# Patient Record
Sex: Female | Born: 1955 | Race: Black or African American | Hispanic: No | Smoking: Never smoker
Health system: Southern US, Community
[De-identification: ages and names within clinical notes are randomized; demographics above are authoritative.]

## PROBLEM LIST (undated history)

## (undated) DIAGNOSIS — E119 Type 2 diabetes mellitus without complications: Secondary | ICD-10-CM

## (undated) DIAGNOSIS — I10 Essential (primary) hypertension: Secondary | ICD-10-CM

## (undated) DIAGNOSIS — E039 Hypothyroidism, unspecified: Secondary | ICD-10-CM

## (undated) DIAGNOSIS — M1990 Unspecified osteoarthritis, unspecified site: Secondary | ICD-10-CM

## (undated) DIAGNOSIS — E785 Hyperlipidemia, unspecified: Secondary | ICD-10-CM

## (undated) DIAGNOSIS — K219 Gastro-esophageal reflux disease without esophagitis: Secondary | ICD-10-CM

## (undated) DIAGNOSIS — G4730 Sleep apnea, unspecified: Secondary | ICD-10-CM

## (undated) DIAGNOSIS — M545 Low back pain, unspecified: Secondary | ICD-10-CM

## (undated) DIAGNOSIS — G8929 Other chronic pain: Secondary | ICD-10-CM

## (undated) DIAGNOSIS — I219 Acute myocardial infarction, unspecified: Secondary | ICD-10-CM

---

## 1982-01-08 HISTORY — PX: TUBAL LIGATION: SHX77

## 1997-11-08 DIAGNOSIS — I219 Acute myocardial infarction, unspecified: Secondary | ICD-10-CM

## 1997-11-08 HISTORY — DX: Acute myocardial infarction, unspecified: I21.9

## 2014-01-06 ENCOUNTER — Inpatient Hospital Stay (HOSPITAL_COMMUNITY)
Admission: EM | Admit: 2014-01-06 | Discharge: 2014-01-11 | DRG: 287 | Disposition: A | Discharge status: Home / Self Care | Payer: Medicare Other | Source: Other Acute Inpatient Hospital | Attending: Internal Medicine | Admitting: Internal Medicine

## 2014-01-06 ENCOUNTER — Encounter (HOSPITAL_COMMUNITY): Payer: Self-pay | Admitting: Internal Medicine

## 2014-01-06 DIAGNOSIS — K210 Gastro-esophageal reflux disease with esophagitis: Secondary | ICD-10-CM

## 2014-01-06 DIAGNOSIS — I2510 Atherosclerotic heart disease of native coronary artery without angina pectoris: Secondary | ICD-10-CM | POA: Diagnosis present

## 2014-01-06 DIAGNOSIS — D649 Anemia, unspecified: Secondary | ICD-10-CM | POA: Diagnosis present

## 2014-01-06 DIAGNOSIS — I700 Atherosclerosis of aorta: Secondary | ICD-10-CM | POA: Diagnosis present

## 2014-01-06 DIAGNOSIS — Z6841 Body Mass Index (BMI) 40.0 and over, adult: Secondary | ICD-10-CM | POA: Diagnosis not present

## 2014-01-06 DIAGNOSIS — R002 Palpitations: Secondary | ICD-10-CM | POA: Diagnosis present

## 2014-01-06 DIAGNOSIS — E876 Hypokalemia: Secondary | ICD-10-CM

## 2014-01-06 DIAGNOSIS — K219 Gastro-esophageal reflux disease without esophagitis: Secondary | ICD-10-CM | POA: Diagnosis present

## 2014-01-06 DIAGNOSIS — E119 Type 2 diabetes mellitus without complications: Secondary | ICD-10-CM

## 2014-01-06 DIAGNOSIS — M545 Low back pain: Secondary | ICD-10-CM | POA: Diagnosis present

## 2014-01-06 DIAGNOSIS — I2699 Other pulmonary embolism without acute cor pulmonale: Secondary | ICD-10-CM

## 2014-01-06 DIAGNOSIS — I10 Essential (primary) hypertension: Secondary | ICD-10-CM | POA: Diagnosis present

## 2014-01-06 DIAGNOSIS — E785 Hyperlipidemia, unspecified: Secondary | ICD-10-CM | POA: Diagnosis present

## 2014-01-06 DIAGNOSIS — G8929 Other chronic pain: Secondary | ICD-10-CM | POA: Diagnosis present

## 2014-01-06 DIAGNOSIS — I252 Old myocardial infarction: Secondary | ICD-10-CM

## 2014-01-06 DIAGNOSIS — R059 Cough, unspecified: Secondary | ICD-10-CM | POA: Diagnosis present

## 2014-01-06 DIAGNOSIS — R9439 Abnormal result of other cardiovascular function study: Secondary | ICD-10-CM | POA: Insufficient documentation

## 2014-01-06 DIAGNOSIS — G4733 Obstructive sleep apnea (adult) (pediatric): Secondary | ICD-10-CM | POA: Diagnosis present

## 2014-01-06 DIAGNOSIS — E039 Hypothyroidism, unspecified: Secondary | ICD-10-CM | POA: Diagnosis present

## 2014-01-06 DIAGNOSIS — R05 Cough: Secondary | ICD-10-CM

## 2014-01-06 DIAGNOSIS — R079 Chest pain, unspecified: Secondary | ICD-10-CM | POA: Diagnosis present

## 2014-01-06 DIAGNOSIS — B349 Viral infection, unspecified: Secondary | ICD-10-CM | POA: Diagnosis present

## 2014-01-06 DIAGNOSIS — R42 Dizziness and giddiness: Secondary | ICD-10-CM

## 2014-01-06 DIAGNOSIS — R0609 Other forms of dyspnea: Secondary | ICD-10-CM | POA: Diagnosis present

## 2014-01-06 DIAGNOSIS — I471 Supraventricular tachycardia, unspecified: Secondary | ICD-10-CM | POA: Diagnosis present

## 2014-01-06 HISTORY — DX: Acute myocardial infarction, unspecified: I21.9

## 2014-01-06 HISTORY — DX: Low back pain, unspecified: M54.50

## 2014-01-06 HISTORY — DX: Other chronic pain: G89.29

## 2014-01-06 HISTORY — DX: Type 2 diabetes mellitus without complications: E11.9

## 2014-01-06 HISTORY — DX: Gastro-esophageal reflux disease without esophagitis: K21.9

## 2014-01-06 HISTORY — DX: Hyperlipidemia, unspecified: E78.5

## 2014-01-06 HISTORY — DX: Sleep apnea, unspecified: G47.30

## 2014-01-06 HISTORY — DX: Low back pain: M54.5

## 2014-01-06 HISTORY — DX: Essential (primary) hypertension: I10

## 2014-01-06 HISTORY — DX: Hypothyroidism, unspecified: E03.9

## 2014-01-06 HISTORY — DX: Unspecified osteoarthritis, unspecified site: M19.90

## 2014-01-06 LAB — GLUCOSE, CAPILLARY: Glucose-Capillary: 109 mg/dL — ABNORMAL HIGH (ref 70–99)

## 2014-01-06 MED ORDER — GUAIFENESIN-DM 100-10 MG/5ML PO SYRP: 5.0000 mL | ORAL_SOLUTION | ORAL | Status: DC | PRN

## 2014-01-06 MED ORDER — HEPARIN SODIUM (PORCINE) 5000 UNIT/ML IJ SOLN
5000.0000 [IU] | Freq: Three times a day (TID) | INTRAMUSCULAR | Status: DC
  Administered 2014-01-07 – 2014-01-11 (×13): 5000 [IU] via SUBCUTANEOUS
  Filled 2014-01-06 (×12): qty 1

## 2014-01-06 MED ORDER — LEVOTHYROXINE SODIUM 50 MCG PO TABS
50.0000 ug | ORAL_TABLET | Freq: Every day | ORAL | Status: DC
  Administered 2014-01-07 – 2014-01-10 (×4): 50 ug via ORAL
  Filled 2014-01-06 (×4): qty 1

## 2014-01-06 MED ORDER — MORPHINE SULFATE 2 MG/ML IJ SOLN: 2.0000 mg | INTRAMUSCULAR | Status: DC | PRN

## 2014-01-06 MED ORDER — ATORVASTATIN CALCIUM 40 MG PO TABS
40.0000 mg | ORAL_TABLET | Freq: Every day | ORAL | Status: DC
  Administered 2014-01-07 – 2014-01-10 (×4): 40 mg via ORAL
  Filled 2014-01-06 (×4): qty 1

## 2014-01-06 MED ORDER — INSULIN GLARGINE 100 UNIT/ML ~~LOC~~ SOLN
15.0000 [IU] | Freq: Every day | SUBCUTANEOUS | Status: DC
  Filled 2014-01-06: qty 0.15

## 2014-01-06 MED ORDER — SODIUM CHLORIDE 0.9 % IJ SOLN
3.0000 mL | Freq: Two times a day (BID) | INTRAMUSCULAR | Status: DC
  Administered 2014-01-07 – 2014-01-11 (×7): 3 mL via INTRAVENOUS

## 2014-01-06 MED ORDER — SODIUM CHLORIDE 0.9 % IV SOLN
INTRAVENOUS | Status: DC
  Administered 2014-01-06: via INTRAVENOUS

## 2014-01-06 MED ORDER — NITROGLYCERIN 0.4 MG SL SUBL: 0.4000 mg | SUBLINGUAL_TABLET | SUBLINGUAL | Status: DC | PRN

## 2014-01-06 MED ORDER — INSULIN GLARGINE 100 UNIT/ML ~~LOC~~ SOLN
15.0000 [IU] | Freq: Every day | SUBCUTANEOUS | Status: DC
  Administered 2014-01-07: 15 [IU] via SUBCUTANEOUS
  Filled 2014-01-06: qty 0.15

## 2014-01-06 MED ORDER — DILTIAZEM HCL 30 MG PO TABS
30.0000 mg | ORAL_TABLET | Freq: Four times a day (QID) | ORAL | Status: DC
  Administered 2014-01-06 – 2014-01-08 (×6): 30 mg via ORAL
  Filled 2014-01-06 (×6): qty 1

## 2014-01-06 MED ORDER — INSULIN ASPART 100 UNIT/ML ~~LOC~~ SOLN
0.0000 [IU] | Freq: Three times a day (TID) | SUBCUTANEOUS | Status: DC
  Administered 2014-01-07: 1 [IU] via SUBCUTANEOUS
  Administered 2014-01-07: 2 [IU] via SUBCUTANEOUS
  Administered 2014-01-08 – 2014-01-09 (×3): 1 [IU] via SUBCUTANEOUS
  Administered 2014-01-10: 2 [IU] via SUBCUTANEOUS
  Administered 2014-01-11: 1 [IU] via SUBCUTANEOUS

## 2014-01-06 MED ORDER — INFLUENZA VAC SPLIT QUAD 0.5 ML IM SUSY
0.5000 mL | PREFILLED_SYRINGE | INTRAMUSCULAR | Status: AC
  Administered 2014-01-07: 0.5 mL via INTRAMUSCULAR
  Filled 2014-01-06: qty 0.5

## 2014-01-06 MED ORDER — ASPIRIN 325 MG PO TABS
325.0000 mg | ORAL_TABLET | Freq: Every day | ORAL | Status: DC
  Administered 2014-01-07 – 2014-01-10 (×4): 325 mg via ORAL
  Filled 2014-01-06 (×4): qty 1

## 2014-01-06 MED ORDER — IRBESARTAN 150 MG PO TABS
150.0000 mg | ORAL_TABLET | Freq: Every day | ORAL | Status: DC
  Administered 2014-01-07 – 2014-01-10 (×4): 150 mg via ORAL
  Filled 2014-01-06 (×4): qty 1

## 2014-01-06 MED ORDER — PNEUMOCOCCAL VAC POLYVALENT 25 MCG/0.5ML IJ INJ
0.5000 mL | INJECTION | INTRAMUSCULAR | Status: AC
  Administered 2014-01-07: 0.5 mL via INTRAMUSCULAR
  Filled 2014-01-06: qty 0.5

## 2014-01-06 MED ORDER — PANTOPRAZOLE SODIUM 40 MG PO TBEC
40.0000 mg | DELAYED_RELEASE_TABLET | Freq: Every day | ORAL | Status: DC
  Administered 2014-01-07 – 2014-01-10 (×4): 40 mg via ORAL
  Filled 2014-01-06 (×4): qty 1

## 2014-01-06 MED ORDER — ACETAMINOPHEN 650 MG RE SUPP: 650.0000 mg | Freq: Four times a day (QID) | RECTAL | Status: DC | PRN

## 2014-01-06 MED ORDER — ACETAMINOPHEN 325 MG PO TABS
650.0000 mg | ORAL_TABLET | Freq: Four times a day (QID) | ORAL | Status: DC | PRN
  Administered 2014-01-08: 650 mg via ORAL
  Filled 2014-01-06: qty 2

## 2014-01-06 NOTE — H&P (Addendum)
[facility] History and Physical  Jeanette Hill ZOX:[id number] DOB: [birth year] DOA: 01/06/2014  Referring physician: ED physician PCP: Alain Honey, PA-C  Specialists:   Chief Complaint: Palpitations and dizziness  HPI: Jeanette Hill is a 58 y.o. female with past medical history of diabetes mellitus, hypertension, hyperlipidemia, OSA, GERD, hypothyroidism, who presented with palpitations and dizziness. Patient is transferred from [facility] to [place].  Patient reports that at about 10:30 aM, she started having palpitation, and feeling that her heart was beating rapidly and abnormally. It was associated with dizziness and sweating. She does not have unilateral weakness or numbness in her extremities. No hearing, or vision change. Patient had 2 episode of mild chest pain while she was having palpitation, each time lasted for about 10-15 minutes. She was evaluated in the ED of [facility]. She was found to have SVT on EKG there. She was treated with the 25 mg of Cardizem, and SVT was converted to sinus rhythm before transfer to [facility] hospital. When I evaluated pt on the floor, she does not have chest pain or palpitation. Repeated EKG showed sinus rhythm.  Patient also reports having mild cough during last week. She coughs up a little whitish sputum. She does not have runny nose or sore throat. No fever, but has chills. No recent history of long distance traveling. No tenderness over the calf areas.  Patient denies fever, headaches, abdominal pain, diarrhea, constipation, dysuria, urgency, frequency, hematuria, skin rashes, leg swelling.  Work up in the ED demonstrates SVT by EKG. no leukocytosis. Portable chest x-ray had a limited study due to obscured soft tissues. No leukocytosis. Patient is admitted to inpatient for further evaluation and treatment.  Review of Systems: As presented in the history of presenting illness, rest negative.  Where does patient live?  At home Can  patient participate in ADLs? Yes  Allergy: Allergies not on file  Past Medical History  Diagnosis Date  . Hypertension   . Hyperlipidemia   . GERD (gastroesophageal reflux disease)   . Hypothyroidism   . Myocardial infarction 11/1997    "slight one"  . Sleep apnea     "at one time; not now" (01/06/2014)  . Type II diabetes mellitus   . Arthritis     "legs, knees, arms, joints" (01/06/2014)  . Chronic lower back pain     Past Surgical History  Procedure Laterality Date  . Tubal ligation  1984    Social History:  reports that she has never smoked. She has never used smokeless tobacco. She reports that she does not drink alcohol or use illicit drugs.  Family History:  Family History  Problem Relation Age of Onset  . Diabetes Mother   . Diabetes Sister   . Diabetes Brother   . Hypertension Brother   . Prostate cancer Father      Prior to Admission medications   Not on File    Physical Exam: Filed Vitals:   01/06/14 2209 01/06/14 2227 01/07/14 0003  BP: 157/92  170/94  Pulse: 81  78  Temp: 98.3 F (36.8 C)  97.5 F (36.4 C)  TempSrc: Oral  Oral  Resp:   18  Height:  5\' 4"  (1.626 m)   Weight:  121.02 kg (266 lb 12.8 oz)   SpO2: 95%  98%   General: Not in acute distress HEENT:       Eyes: PERRL, EOMI, no scleral icterus       ENT: No discharge from the ears and nose, no  pharynx injection, no tonsillar enlargement.        Neck: No JVD, no bruit, no mass felt. Cardiac: S1/S2, RRR, No murmurs, No gallops or rubs Pulm: Good air movement bilaterally. Clear to auscultation bilaterally. No rales, wheezing, rhonchi or rubs. There is mild chest wall tenderness over left front chest. Abd: Soft, nondistended, nontender, no rebound pain, no organomegaly, BS present Ext: No edema bilaterally. 2+DP/PT pulse bilaterally. No tenderness over the calf areas. Musculoskeletal: No joint deformities, erythema, or stiffness, ROM full Skin: No rashes.  Neuro: Alert and oriented X3,  cranial nerves II-XII grossly intact, muscle strength 5/5 in all extremeties, sensation to light touch intact. Brachial reflex 2+ bilaterally. Knee reflex 1+ bilaterally. Negative Babinski's sign. Normal finger to nose test. Psych: Patient is not psychotic, no suicidal or hemocidal ideation.  Labs on Admission:  Basic Metabolic Panel: No results for input(s): NA, K, CL, CO2, GLUCOSE, BUN, CREATININE, CALCIUM, MG, PHOS in the last 168 hours. Liver Function Tests: No results for input(s): AST, ALT, ALKPHOS, BILITOT, PROT, ALBUMIN in the last 168 hours. No results for input(s): LIPASE, AMYLASE in the last 168 hours. No results for input(s): AMMONIA in the last 168 hours. CBC: No results for input(s): WBC, NEUTROABS, HGB, HCT, MCV, PLT in the last 168 hours. Cardiac Enzymes: No results for input(s): CKTOTAL, CKMB, CKMBINDEX, TROPONINI in the last 168 hours.  BNP (last 3 results) No results for input(s): PROBNP in the last 8760 hours. CBG:  Recent Labs Lab 01/06/14 2351  GLUCAP 109*    Radiological Exams on Admission: No results found.  EKG: Independently reviewed.   Assessment/Plan Principal Problem:   Palpitation Active Problems:   Anemia   Dizziness   Essential hypertension   Diabetes mellitus without complication   HLD (hyperlipidemia)   OSA (obstructive sleep apnea)   GERD (gastroesophageal reflux disease)   Hypothyroidism   Chest pain   Cough  Palpitation: Patient had SVT which was converted to the sinus rhythm after treated with Cardizem. The triggering factors is not clear. Differential diagnosis include, ACS given her significant risk factors, hypoxia secondary to possible bronchitis or pneumonia given her cough, pulmonary embolism. - will admit to tele bed - trop x 3 - repeat CXR-2 view - 2 d echo - lipitor, ASA, prn NTG and morphine - Cardizem 30 mg q6h - stat D-dimer - check A1c, FLP and TSH - UDS - NPO - IVF: ns 100cc/h  Chest pain: Her significant  risk factors, we'll need to rule out ACS -trop x 3 -ekg in AM  Cough: mild cough, likely due to viral infection. -Repeat chest x-ray-2 view -Robitussin  Hypertension: Patient was on amlodipine and Benicar/HCTC at home. -will hold amlodipine since patient was started Cardizem -switch benicar to Irbesartan  Diabetes mellitus: Patient was on S, metformin and glipizide at home. - switch oral meds to sliding scale insulin -Decrease Lantus dose from 25 units to 15 units when patient is on nothing by mouth -check FLP  Hypothyroidism: Patient is on Synthroid 50 mcg at home  - continue Synthroid - check tsh  Hyperlipidemia: Patient was on Simvastatin at home  - switch to high potent Lipitor 40 mg daily  GERD: -Protonix  Anemia:  -check anemia panel  Addendum: 12:44 AM. Patient's d-dimer cameback positive at 1.25. -will stat CT angiogram to rule out PE.  DVT ppx: SQ Heparin    Code Status: Full code Family Communication: None at bed side.  Disposition Plan: Admit to inpatient   Date  of Service 01/07/2014    Lorretta HarpIU, Basya Casavant [facility] Pager [phone]  If 7PM-7AM, please contact night-coverage [email or web address] Password [email or web address] 01/07/2014, 12:26 AM

## 2014-01-07 ENCOUNTER — Encounter (HOSPITAL_COMMUNITY): Payer: Self-pay | Admitting: Radiology

## 2014-01-07 ENCOUNTER — Inpatient Hospital Stay (HOSPITAL_COMMUNITY): Payer: Medicare Other

## 2014-01-07 ENCOUNTER — Telehealth: Payer: Self-pay | Admitting: Cardiology

## 2014-01-07 DIAGNOSIS — I10 Essential (primary) hypertension: Secondary | ICD-10-CM

## 2014-01-07 DIAGNOSIS — R002 Palpitations: Secondary | ICD-10-CM

## 2014-01-07 DIAGNOSIS — I471 Supraventricular tachycardia: Secondary | ICD-10-CM

## 2014-01-07 DIAGNOSIS — G4733 Obstructive sleep apnea (adult) (pediatric): Secondary | ICD-10-CM

## 2014-01-07 DIAGNOSIS — R079 Chest pain, unspecified: Secondary | ICD-10-CM

## 2014-01-07 DIAGNOSIS — E876 Hypokalemia: Secondary | ICD-10-CM

## 2014-01-07 LAB — CBC
HCT: 35.1 % — ABNORMAL LOW (ref 36.0–46.0)
Hemoglobin: 10.7 g/dL — ABNORMAL LOW (ref 12.0–15.0)
MCH: 23.9 pg — ABNORMAL LOW (ref 26.0–34.0)
MCHC: 30.5 g/dL (ref 30.0–36.0)
MCV: 78.3 fL (ref 78.0–100.0)
Platelets: 223 10*3/uL (ref 150–400)
RBC: 4.48 MIL/uL (ref 3.87–5.11)
RDW: 17.1 % — ABNORMAL HIGH (ref 11.5–15.5)
WBC: 7.4 10*3/uL (ref 4.0–10.5)

## 2014-01-07 LAB — LIPID PANEL
CHOL/HDL RATIO: 4 ratio
Cholesterol: 123 mg/dL (ref 0–200)
HDL: 31 mg/dL — AB (ref >39)
LDL Cholesterol: 80 mg/dL (ref 0–99)
Triglycerides: 60 mg/dL (ref <150)
VLDL: 12 mg/dL (ref 0–40)

## 2014-01-07 LAB — FERRITIN: Ferritin: 95 ng/mL (ref 10–291)

## 2014-01-07 LAB — D-DIMER, QUANTITATIVE: D-Dimer, Quant: 1.53 ug/mL-FEU — ABNORMAL HIGH (ref 0.00–0.48)

## 2014-01-07 LAB — COMPREHENSIVE METABOLIC PANEL
ALK PHOS: 92 U/L (ref 39–117)
ALT: 16 U/L (ref 0–35)
AST: 19 U/L (ref 0–37)
Albumin: 3.4 g/dL — ABNORMAL LOW (ref 3.5–5.2)
Anion gap: 8 (ref 5–15)
BILIRUBIN TOTAL: 0.3 mg/dL (ref 0.3–1.2)
BUN: 10 mg/dL (ref 6–23)
CO2: 29 mmol/L (ref 19–32)
CREATININE: 0.87 mg/dL (ref 0.50–1.10)
Calcium: 9.3 mg/dL (ref 8.4–10.5)
Chloride: 102 mEq/L (ref 96–112)
GFR, EST AFRICAN AMERICAN: 82 mL/min — AB (ref >90)
GFR, EST NON AFRICAN AMERICAN: 71 mL/min — AB (ref >90)
Glucose, Bld: 120 mg/dL — ABNORMAL HIGH (ref 70–99)
Potassium: 3.4 mmol/L — ABNORMAL LOW (ref 3.5–5.1)
SODIUM: 139 mmol/L (ref 135–145)
Total Protein: 7.2 g/dL (ref 6.0–8.3)

## 2014-01-07 LAB — RAPID URINE DRUG SCREEN, HOSP PERFORMED
AMPHETAMINES: NOT DETECTED
BENZODIAZEPINES: NOT DETECTED
Barbiturates: NOT DETECTED
COCAINE: NOT DETECTED
OPIATES: NOT DETECTED
Tetrahydrocannabinol: NOT DETECTED

## 2014-01-07 LAB — EXPECTORATED SPUTUM ASSESSMENT W REFEX TO RESP CULTURE

## 2014-01-07 LAB — TROPONIN I
Troponin I: <0.03 ng/mL (ref <0.031)
Troponin I: <0.03 ng/mL (ref <0.031)

## 2014-01-07 LAB — IRON AND TIBC
IRON: 34 ug/dL — AB (ref 42–145)
SATURATION RATIOS: 11 % — AB (ref 20–55)
TIBC: 297 ug/dL (ref 250–470)
UIBC: 263 ug/dL (ref 125–400)

## 2014-01-07 LAB — RETICULOCYTES
RBC.: 4.81 MIL/uL (ref 3.87–5.11)
RETIC COUNT ABSOLUTE: 105.8 10*3/uL (ref 19.0–186.0)
Retic Ct Pct: 2.2 % (ref 0.4–3.1)

## 2014-01-07 LAB — GLUCOSE, CAPILLARY
GLUCOSE-CAPILLARY: 123 mg/dL — AB (ref 70–99)
GLUCOSE-CAPILLARY: 147 mg/dL — AB (ref 70–99)
Glucose-Capillary: 135 mg/dL — ABNORMAL HIGH (ref 70–99)
Glucose-Capillary: 169 mg/dL — ABNORMAL HIGH (ref 70–99)

## 2014-01-07 LAB — HEMOGLOBIN A1C
HEMOGLOBIN A1C: 6.7 % — AB (ref <5.7)
MEAN PLASMA GLUCOSE: 146 mg/dL — AB (ref <117)

## 2014-01-07 LAB — PROTIME-INR
INR: 1.22 (ref 0.00–1.49)
Prothrombin Time: 15.6 seconds — ABNORMAL HIGH (ref 11.6–15.2)

## 2014-01-07 LAB — FOLATE

## 2014-01-07 LAB — TSH: TSH: 1.794 u[IU]/mL (ref 0.350–4.500)

## 2014-01-07 LAB — EXPECTORATED SPUTUM ASSESSMENT W GRAM STAIN, RFLX TO RESP C

## 2014-01-07 LAB — BRAIN NATRIURETIC PEPTIDE: B Natriuretic Peptide: 117.2 pg/mL — ABNORMAL HIGH (ref 0.0–100.0)

## 2014-01-07 LAB — VITAMIN B12: Vitamin B-12: 937 pg/mL — ABNORMAL HIGH (ref 211–911)

## 2014-01-07 MED ORDER — IOHEXOL 350 MG/ML SOLN
80.0000 mL | Freq: Once | INTRAVENOUS | Status: AC | PRN
  Administered 2014-01-07: 74.5 mL via INTRAVENOUS

## 2014-01-07 MED ORDER — POTASSIUM CHLORIDE CRYS ER 20 MEQ PO TBCR
40.0000 meq | EXTENDED_RELEASE_TABLET | Freq: Once | ORAL | Status: AC
  Administered 2014-01-07: 40 meq via ORAL
  Filled 2014-01-07: qty 4

## 2014-01-07 MED ORDER — INSULIN GLARGINE 100 UNIT/ML ~~LOC~~ SOLN
25.0000 [IU] | Freq: Every day | SUBCUTANEOUS | Status: DC
  Administered 2014-01-08 – 2014-01-10 (×3): 25 [IU] via SUBCUTANEOUS
  Filled 2014-01-07 (×4): qty 0.25

## 2014-01-07 NOTE — Progress Notes (Signed)
PROGRESS NOTE  Jeanette Hill WUJ:[id number] DOB: 1955-08-20 DOA: 01/06/2014 PCP: Jeanette Hill, Jeanette Hill  Assessment/Plan: Palpitation:  -SVT which was converted to sinus rhythm after treated with Cardizem.  - trop x 3 neg - CXR-2 view: cardiomegaly - 2 d echo - lipitor, ASA, prn NTG and morphine - Cardizem 30 mg q6h - D-dimer + but CTA negative - check A1c, FLP and TSH -will need outpatient stress test and OSA   Chest pain: Her significant risk factors, we'll need to rule out ACS -trop x 3  Cough: mild cough, likely due to viral infection. -Robitussin  Hypertension: Patient was on amlodipine and Benicar/HCTC at home. -will hold amlodipine since patient was started Cardizem -switch benicar to -sartan  Diabetes mellitus: Patient was on S, metformin and glipizide at home. - switch oral meds to sliding scale insulin  Hypothyroidism: Patient is on Synthroid 50 mcg at home  - continue Synthroid -tsh ok  Hyperlipidemia: Patient was on Simvastatin at home  - switch to high potent Lipitor 40 mg daily  GERD: -Protonix  Anemia:  -check anemia panel  Hypokalemia -replete  Code Status: full Family Communication: patient Disposition Plan: home in AM if echo ok?   Consultants:  cards  Procedures:  echo   HPI/Subjective: Feeling better  Objective: Filed Vitals:   01/07/14 0537  BP: 136/78  Pulse: 90  Temp: 98.4 F (36.9 C)  Resp: 18    Intake/Output Summary (Last 24 hours) at 01/07/14 1116 Last data filed at 01/07/14 1000  Gross per 24 hour  Intake 966.67 ml  Output    750 ml  Net 216.67 ml   Filed Weights   01/06/14 2227 01/07/14 0537  Weight: 121.02 kg (266 lb 12.8 oz) 120.974 kg (266 lb 11.2 oz)    Exam:   General:  A+Ox3, NAD  Cardiovascular: rrr  Respiratory: clear  Abdomen: +BS, soft  Musculoskeletal: no edema   Data Reviewed: Basic Metabolic Panel:  Recent Labs Lab 01/07/14 0440  NA 139  K 3.4*  CL 102  CO2 29    GLUCOSE 120*  BUN 10  CREATININE 0.87  CALCIUM 9.3   Liver Function Tests:  Recent Labs Lab 01/07/14 0440  AST 19  ALT 16  ALKPHOS 92  BILITOT 0.3  PROT 7.2  ALBUMIN 3.4*   No results for input(s): LIPASE, AMYLASE in the last 168 hours. No results for input(s): AMMONIA in the last 168 hours. CBC:  Recent Labs Lab 01/07/14 0440  WBC 7.4  HGB 10.7*  HCT 35.1*  MCV 78.3  PLT 223   Cardiac Enzymes:  Recent Labs Lab 01/06/14 2348 01/07/14 0440  TROPONINI <0.03 <0.03   BNP (last 3 results) No results for input(s): PROBNP in the last 8760 hours. CBG:  Recent Labs Lab 01/06/14 2351 01/07/14 0735  GLUCAP 109* 135*    Recent Results (from the past 240 hour(s))  Culture, sputum-assessment     Status: None   Collection Time: 01/07/14  1:05 AM  Result Value Ref Range Status   Specimen Description SPUTUM  Final   Special Requests NONE  Final   Sputum evaluation   Final    THIS SPECIMEN IS ACCEPTABLE. RESPIRATORY CULTURE REPORT TO FOLLOW.   Report Status 01/07/2014 FINAL  Final     Studies: Dg Chest 2 View  01/07/2014   CLINICAL DATA:  Cough.  Chest pain.  Short of breath.  EXAM: CHEST  2 VIEW  COMPARISON:  None.  FINDINGS: Lateral view degraded by patient  arm position. Moderate thoracic spondylosis. Numerous leads and wires project over the chest. Degenerate changes in both glenohumeral joints. Midline trachea. Mild cardiomegaly. Aortic atherosclerosis. Mediastinal contours otherwise within normal limits. No pleural effusion or pneumothorax. No congestive failure. Clear lungs.  IMPRESSION: Cardiomegaly without congestive failure.  Aortic atherosclerosis.   Electronically Signed   By: Jeanette GreavesKyle  Talbot M.D.   On: 01/07/2014 00:51   Ct Angio Chest Pe W/cm &/or Wo Cm  01/07/2014   CLINICAL DATA:  Pulmonary embolism. Palpitations and positive D-dimer.  EXAM: CT ANGIOGRAPHY CHEST WITH CONTRAST  TECHNIQUE: Multidetector CT imaging of the chest was performed using the  standard protocol during bolus administration of intravenous contrast. Multiplanar CT image reconstructions and MIPs were obtained to evaluate the vascular anatomy.  CONTRAST:  74.105mL OMNIPAQUE IOHEXOL 350 MG/ML SOLN  COMPARISON:  None.  FINDINGS: THORACIC INLET/BODY WALL:  No acute abnormality.  MEDIASTINUM:  Cardiomegaly. No pericardial effusion. There is no convincing pulmonary embolism. Note that respiratory motion and soft tissue attenuation are limiting factors, with motion especially prominent at the right apex and at both bases. This particularly limits evaluation of subsegmental vessels. No acute aortic pathology. No adenopathy.  LUNG WINDOWS:  No consolidation.  No effusion.  No suspicious pulmonary nodule.  UPPER ABDOMEN:  No acute findings.  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  Review of the MIP images confirms the above findings.  IMPRESSION: 1. Negative for pulmonary embolism. Note that respiratory motion limits assessment at multiple levels, mainly of the subsegmental arterial tree. 2. Cardiomegaly without failure.   Electronically Signed   By: Jeanette PeaJonathan  Watts M.D.   On: 01/07/2014 02:28    Scheduled Meds: . aspirin  325 mg Oral Daily  . atorvastatin  40 mg Oral q1800  . diltiazem  30 mg Oral 4 times per day  . heparin  5,000 Units Subcutaneous 3 times per day  . insulin aspart  0-9 Units Subcutaneous TID WC  . insulin glargine  15 Units Subcutaneous Daily  . irbesartan  150 mg Oral Daily  . levothyroxine  50 mcg Oral QAC breakfast  . pantoprazole  40 mg Oral Q1200  . potassium chloride  40 mEq Oral Once  . sodium chloride  3 mL Intravenous Q12H   Continuous Infusions: . sodium chloride 100 mL/hr at 01/06/14 2344   Antibiotics Given (last 72 hours)    None      Principal Problem:   Palpitation Active Problems:   Anemia   Dizziness   Essential hypertension   Diabetes mellitus without complication   HLD  (hyperlipidemia)   OSA (obstructive sleep apnea)   GERD (gastroesophageal reflux disease)   Hypothyroidism   Chest pain   Cough    Time spent: 35 min    Jeanette Hill  [facility] Pager [phone] If 7PM-7AM, please contact night-coverage at [email or web address], password [email or web address] 01/07/2014, 11:16 AM  LOS: 1 day

## 2014-01-07 NOTE — Consult Note (Addendum)
Reason for Consult: Chest Pain Referring Physician: Ricardo JerichoVann  Jeanette Hill is an 58 y.o. female.  HPI:   The patient is a 58yo morbidly obese female with a history of HTN, HLD, GERD, CAD, DMII, OSA, hypothyroidism.  Patient was seen at [facility] with SVT which converted to NSR with cardizem.   Negative PE on CT angio.  CXR: cardiomegaly, Aortic atherosclerosis.  She had a CPAP at one point but it was taken away because she did not use it.  She had an MI in 1999.  No stents.  Her mom died at 1765 with "heart trouble".  Dad is 95 and has CAD.    The patient presents with dizziness, Rapid HR, diaphoresis and a little SOB.  This began yesterday.  No CP/tightness, orthopnea, PND.  She also reports a little abd pain.  The patient currently denies nausea, vomiting, fever,  PND, cough, congestion, hematochezia, melena, lower extremity edema, claudication.   Past Medical History  Diagnosis Date  . Hypertension   . Hyperlipidemia   . GERD (gastroesophageal reflux disease)   . Hypothyroidism   . Myocardial infarction 11/1997    "slight one"  . Sleep apnea     "at one time; not now" (01/06/2014)  . Type II diabetes mellitus   . Arthritis     "legs, knees, arms, joints" (01/06/2014)  . Chronic lower back pain     Past Surgical History  Procedure Laterality Date  . Tubal ligation  1984    Family History  Problem Relation Age of Onset  . Diabetes Mother   . Diabetes Sister   . Diabetes Brother   . Hypertension Brother   . Prostate cancer Father     Social History:  reports that she has never smoked. She has never used smokeless tobacco. She reports that she does not drink alcohol or use illicit drugs.  Allergies: Not on File  Medications:  Scheduled Meds: . aspirin  325 mg Oral Daily  . atorvastatin  40 mg Oral q1800  . diltiazem  30 mg Oral 4 times per day  . heparin  5,000 Units Subcutaneous 3 times per day  . Influenza vac split quadrivalent PF  0.5 mL Intramuscular  Tomorrow-1000  . insulin aspart  0-9 Units Subcutaneous TID WC  . insulin glargine  15 Units Subcutaneous Daily  . irbesartan  150 mg Oral Daily  . levothyroxine  50 mcg Oral QAC breakfast  . pantoprazole  40 mg Oral Q1200  . pneumococcal 23 valent vaccine  0.5 mL Intramuscular Tomorrow-1000  . sodium chloride  3 mL Intravenous Q12H   Continuous Infusions: . sodium chloride 100 mL/hr at 01/06/14 2344   PRN Meds:.acetaminophen **OR** acetaminophen, guaiFENesin-dextromethorphan, morphine injection, nitroGLYCERIN   Results for orders placed or performed during the hospital encounter of 01/06/14 (from the past 48 hour(s))  Troponin I (q 6hr x 3)     Status: None   Collection Time: 01/06/14 11:48 PM  Result Value Ref Range   Troponin I <0.03 <0.031 ng/mL    Comment:        NO INDICATION OF MYOCARDIAL INJURY. Please note change in reference range.   Reticulocytes     Status: None   Collection Time: 01/06/14 11:48 PM  Result Value Ref Range   Retic Ct Pct 2.2 0.4 - 3.1 %   RBC. 4.81 3.87 - 5.11 MIL/uL   Retic Count, Manual 105.8 19.0 - 186.0 K/uL  D-dimer, quantitative     Status:  Abnormal   Collection Time: 01/06/14 11:48 PM  Result Value Ref Range   D-Dimer, Quant 1.53 (H) 0.00 - 0.48 ug/mL-FEU    Comment:        AT THE INHOUSE ESTABLISHED CUTOFF VALUE OF 0.48 ug/mL FEU, THIS ASSAY HAS BEEN DOCUMENTED IN THE LITERATURE TO HAVE A SENSITIVITY AND NEGATIVE PREDICTIVE VALUE OF AT LEAST 98 TO 99%.  THE TEST RESULT SHOULD BE CORRELATED WITH AN ASSESSMENT OF THE CLINICAL PROBABILITY OF DVT / VTE.   Brain natriuretic peptide     Status: Abnormal   Collection Time: 01/06/14 11:48 PM  Result Value Ref Range   B Natriuretic Peptide 117.2 (H) 0.0 - 100.0 pg/mL    Comment: Please note change in reference range.  Glucose, capillary     Status: Abnormal   Collection Time: 01/06/14 11:51 PM  Result Value Ref Range   Glucose-Capillary 109 (H) 70 - 99 mg/dL  Culture,  sputum-assessment     Status: None   Collection Time: 01/07/14  1:05 AM  Result Value Ref Range   Specimen Description SPUTUM    Special Requests NONE    Sputum evaluation      THIS SPECIMEN IS ACCEPTABLE. RESPIRATORY CULTURE REPORT TO FOLLOW.   Report Status 01/07/2014 FINAL   Urine rapid drug screen (hosp performed)     Status: None   Collection Time: 01/07/14  2:24 AM  Result Value Ref Range   Opiates NONE DETECTED NONE DETECTED   Cocaine NONE DETECTED NONE DETECTED   Benzodiazepines NONE DETECTED NONE DETECTED   Amphetamines NONE DETECTED NONE DETECTED   Tetrahydrocannabinol NONE DETECTED NONE DETECTED   Barbiturates NONE DETECTED NONE DETECTED    Comment:        DRUG SCREEN FOR MEDICAL PURPOSES ONLY.  IF CONFIRMATION IS NEEDED FOR ANY PURPOSE, NOTIFY LAB WITHIN 5 DAYS.        LOWEST DETECTABLE LIMITS FOR URINE DRUG SCREEN Drug Class       Cutoff (ng/mL) Amphetamine      1000 Barbiturate      200 Benzodiazepine   200 Tricyclics       300 Opiates          300 Cocaine          300 THC              50   TSH     Status: None   Collection Time: 01/07/14  4:40 AM  Result Value Ref Range   TSH 1.794 0.350 - 4.500 uIU/mL  Lipid panel     Status: Abnormal   Collection Time: 01/07/14  4:40 AM  Result Value Ref Range   Cholesterol 123 0 - 200 mg/dL   Triglycerides 60 <960<150 mg/dL   HDL 31 (L) >45>39 mg/dL   Total CHOL/HDL Ratio 4.0 RATIO   VLDL 12 0 - 40 mg/dL   LDL Cholesterol 80 0 - 99 mg/dL    Comment:        Total Cholesterol/HDL:CHD Risk Coronary Heart Disease Risk Table                     Men   Women  1/2 Average Risk   3.4   3.3  Average Risk       5.0   4.4  2 X Average Risk   9.6   7.1  3 X Average Risk  23.4   11.0        Use the calculated Patient Ratio above  and the CHD Risk Table to determine the patient's CHD Risk.        ATP III CLASSIFICATION (LDL):  <100     mg/dL   Optimal  161-096  mg/dL   Near or Above                    Optimal  130-159  mg/dL    Borderline  045-409  mg/dL   High  >811     mg/dL   Very High   Protime-INR     Status: Abnormal   Collection Time: 01/07/14  4:40 AM  Result Value Ref Range   Prothrombin Time 15.6 (H) 11.6 - 15.2 seconds   INR 1.22 0.00 - 1.49  CBC     Status: Abnormal   Collection Time: 01/07/14  4:40 AM  Result Value Ref Range   WBC 7.4 4.0 - 10.5 K/uL   RBC 4.48 3.87 - 5.11 MIL/uL   Hemoglobin 10.7 (L) 12.0 - 15.0 g/dL   HCT 91.4 (L) 78.2 - 95.6 %   MCV 78.3 78.0 - 100.0 fL   MCH 23.9 (L) 26.0 - 34.0 pg   MCHC 30.5 30.0 - 36.0 g/dL   RDW 21.3 (H) 08.6 - 57.8 %   Platelets 223 150 - 400 K/uL  Glucose, capillary     Status: Abnormal   Collection Time: 01/07/14  7:35 AM  Result Value Ref Range   Glucose-Capillary 135 (H) 70 - 99 mg/dL    Dg Chest 2 View  46/96/2952   CLINICAL DATA:  Cough.  Chest pain.  Short of breath.  EXAM: CHEST  2 VIEW  COMPARISON:  None.  FINDINGS: Lateral view degraded by patient arm position. Moderate thoracic spondylosis. Numerous leads and wires project over the chest. Degenerate changes in both glenohumeral joints. Midline trachea. Mild cardiomegaly. Aortic atherosclerosis. Mediastinal contours otherwise within normal limits. No pleural effusion or pneumothorax. No congestive failure. Clear lungs.  IMPRESSION: Cardiomegaly without congestive failure.  Aortic atherosclerosis.   Electronically Signed   By: Jeronimo Greaves M.D.   On: 01/07/2014 00:51   Ct Angio Chest Pe W/cm &/or Wo Cm  01/07/2014   CLINICAL DATA:  Pulmonary embolism. Palpitations and positive D-dimer.  EXAM: CT ANGIOGRAPHY CHEST WITH CONTRAST  TECHNIQUE: Multidetector CT imaging of the chest was performed using the standard protocol during bolus administration of intravenous contrast. Multiplanar CT image reconstructions and MIPs were obtained to evaluate the vascular anatomy.  CONTRAST:  74.55mL OMNIPAQUE IOHEXOL 350 MG/ML SOLN  COMPARISON:  None.  FINDINGS: THORACIC INLET/BODY WALL:  No acute abnormality.   MEDIASTINUM:  Cardiomegaly. No pericardial effusion. There is no convincing pulmonary embolism. Note that respiratory motion and soft tissue attenuation are limiting factors, with motion especially prominent at the right apex and at both bases. This particularly limits evaluation of subsegmental vessels. No acute aortic pathology. No adenopathy.  LUNG WINDOWS:  No consolidation.  No effusion.  No suspicious pulmonary nodule.  UPPER ABDOMEN:  No acute findings.  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  Review of the MIP images confirms the above findings.  IMPRESSION: 1. Negative for pulmonary embolism. Note that respiratory motion limits assessment at multiple levels, mainly of the subsegmental arterial tree. 2. Cardiomegaly without failure.   Electronically Signed   By: Tiburcio Pea M.D.   On: 01/07/2014 02:28    Review of Systems  Constitutional: Positive for diaphoresis. Negative for fever.  HENT: Negative for congestion.   Respiratory: Positive  for shortness of breath. Negative for cough.   Cardiovascular: Positive for palpitations. Negative for chest pain, orthopnea, leg swelling and PND.  Gastrointestinal: Positive for abdominal pain. Negative for nausea, vomiting, blood in stool and melena.  Genitourinary: Negative for dysuria.  Musculoskeletal: Negative for myalgias.  Neurological: Positive for dizziness.  All other systems reviewed and are negative.  Blood pressure 136/78, pulse 90, temperature 98.4 F (36.9 C), temperature source Oral, resp. rate 18, height 5\' 4"  (1.626 m), weight 266 lb 11.2 oz (120.974 kg), SpO2 96 %. Physical Exam  Nursing note and vitals reviewed. Constitutional: She is oriented to person, place, and time. She appears well-developed. No distress.  Obese  HENT:  Head: Normocephalic and atraumatic.  Mouth/Throat: Oropharynx is clear and moist. No oropharyngeal exudate.  Eyes: EOM are normal. Pupils are equal,  round, and reactive to light. No scleral icterus.  Neck: Normal range of motion. Neck supple. No JVD present.  Cardiovascular: Normal rate, regular rhythm, S1 normal and S2 normal.   No murmur heard. Pulses:      Radial pulses are 2+ on the right side, and 2+ on the left side.       Dorsalis pedis pulses are 2+ on the right side, and 2+ on the left side.  No carotid bruits  Respiratory: Breath sounds normal. She has no wheezes. She has no rales.  Restricted effort  GI: Soft. Bowel sounds are normal. She exhibits no distension. There is no tenderness.  Musculoskeletal: She exhibits edema (Trace LEE).  Lymphadenopathy:    She has no cervical adenopathy.  Neurological: She is alert and oriented to person, place, and time. She exhibits normal muscle tone.  Skin: Skin is warm.  Skin moist  Psychiatric: She has a normal mood and affect.    Assessment/Plan: Principal Problem:   Palpitation Active Problems:   Anemia   Dizziness   Essential hypertension   Diabetes mellitus without complication   HLD (hyperlipidemia)   OSA (obstructive sleep apnea)   GERD (gastroesophageal reflux disease)   Hypothyroidism   Chest pain   Cough  Plan:   Check echocardiogram.  Now on cardizem. SVT could be related to untreated OSA and right heart strain.  Recommend repeat sleep study and emphasis on importance of CPAP.  Troponin cycling.  POC negative.  Consider nuclear stress to rule ischemia, however, no CP with SVT.Marland Kitchen   Lipids: LDL 80, TG 60. TSH  1.79.  Further recs after echo.  Wilburt Finlay, PAC 01/07/2014, 8:00 AM   I have personally reviewed chart and interviewed and examined patient and agree with note as outlined above by Wilburt Finlay, PA-C.  Patient has not had any chest pain in the past although does have some DOE.  Developed severe palpitations yesterday.  She also had very sharp stabbing CP during the palpitations that resolved after conversion to NSR.  She denies any exertional CP.  She does  have some chronic DOE but is morbidly obese.  EKG shows NSR with PAC's and septal infarct and LAFB.  Exam is benign.  Recommend checking 2D echo.  If LVF is normal then ok to discharge home on PO Cardizem and get an outpt 2 day Lexiscan myoview to assess for ischemia given her DOE.  I will also arrange for her to have a split night sleep study to rule out sleep apnea. Would change Cardizem to CD 180mg  daily at discharge.  Signed: Armanda Magic, MD [facility] 01/07/2014

## 2014-01-07 NOTE — Telephone Encounter (Signed)
Patient currently in the hospital but needs to be set up for a split night PSG for sleep apnea

## 2014-01-07 NOTE — Progress Notes (Signed)
Echocardiogram 2D Echocardiogram has been performed.  Dorothey BasemanReel, Masiyah Engen M 01/07/2014, 10:56 AM

## 2014-01-07 NOTE — Progress Notes (Signed)
Chaplain responded to spiritual care consult for pt requesting prayer. Pt is concerned about her heart and the way she has been feeling physically. Chaplain offered prayer and emotional support. Chaplain made pt aware of her services should she need chaplain support. Page chaplain as needed.    01/07/14 1300  Clinical Encounter Type  Visited With Patient  Visit Type Initial;Spiritual support  Referral From Nurse  Spiritual Encounters  Spiritual Needs Emotional;Prayer  Stress Factors  Patient Stress Factors Lack of knowledge  Jeanette Hill, Mayer MaskerCourtney F, Chaplain 01/07/2014 1:10 PM

## 2014-01-08 DIAGNOSIS — I471 Supraventricular tachycardia: Secondary | ICD-10-CM | POA: Diagnosis present

## 2014-01-08 DIAGNOSIS — R0609 Other forms of dyspnea: Secondary | ICD-10-CM | POA: Diagnosis present

## 2014-01-08 LAB — CBC
HEMATOCRIT: 35.5 % — AB (ref 36.0–46.0)
Hemoglobin: 10.8 g/dL — ABNORMAL LOW (ref 12.0–15.0)
MCH: 24.5 pg — ABNORMAL LOW (ref 26.0–34.0)
MCHC: 30.4 g/dL (ref 30.0–36.0)
MCV: 80.7 fL (ref 78.0–100.0)
PLATELETS: 209 10*3/uL (ref 150–400)
RBC: 4.4 MIL/uL (ref 3.87–5.11)
RDW: 17.2 % — AB (ref 11.5–15.5)
WBC: 6.7 10*3/uL (ref 4.0–10.5)

## 2014-01-08 LAB — BASIC METABOLIC PANEL
ANION GAP: 14 (ref 5–15)
BUN: 9 mg/dL (ref 6–23)
CALCIUM: 9.1 mg/dL (ref 8.4–10.5)
CO2: 22 mmol/L (ref 19–32)
Chloride: 103 mEq/L (ref 96–112)
Creatinine, Ser: 0.85 mg/dL (ref 0.50–1.10)
GFR calc Af Amer: 85 mL/min — ABNORMAL LOW (ref >90)
GFR calc non Af Amer: 73 mL/min — ABNORMAL LOW (ref >90)
Glucose, Bld: 145 mg/dL — ABNORMAL HIGH (ref 70–99)
Potassium: 4.1 mmol/L (ref 3.5–5.1)
Sodium: 139 mmol/L (ref 135–145)

## 2014-01-08 LAB — GLUCOSE, CAPILLARY
GLUCOSE-CAPILLARY: 144 mg/dL — AB (ref 70–99)
GLUCOSE-CAPILLARY: 134 mg/dL — AB (ref 70–99)
Glucose-Capillary: 97 mg/dL (ref 70–99)
Glucose-Capillary: 118 mg/dL — ABNORMAL HIGH (ref 70–99)

## 2014-01-08 MED ORDER — DILTIAZEM HCL ER COATED BEADS 180 MG PO CP24
180.0000 mg | ORAL_CAPSULE | Freq: Every day | ORAL | Status: DC
  Administered 2014-01-08 – 2014-01-10 (×3): 180 mg via ORAL
  Filled 2014-01-08 (×4): qty 1

## 2014-01-08 NOTE — Progress Notes (Signed)
PROGRESS NOTE  Jeanette Hill ZOX:[id number] DOB: 12/09/1955 DOA: 01/06/2014 PCP: Alain Honey, PA-C  Assessment/Plan: Palpitation:  -SVT which was converted to sinus rhythm after treated with Cardizem.  - trop x 3 neg - CXR-2 view: cardiomegaly - 2 d echo- EF 45 % - lipitor, ASA, prn NTG and morphine - Cardizem long acting - D-dimer + but CTA negative - check A1c, FLP and TSH -for 2 day stress test Need outpatient OSA eval  Chest pain: Her significant risk factors, we'll need to rule out ACS -trop x 3  Cough: mild cough, likely due to viral infection. -Robitussin  Hypertension: Patient was on amlodipine and Benicar/HCTC at home. -will hold amlodipine since patient was started Cardizem -switch benicar to -sartan  Diabetes mellitus: Patient was on S, metformin and glipizide at home. - switch oral meds to sliding scale insulin  Hypothyroidism: Patient is on Synthroid 50 mcg at home  - continue Synthroid -tsh ok  Hyperlipidemia: Patient was on Simvastatin at home  - switch to high potent Lipitor 40 mg daily  GERD: -Protonix  Anemia:  -check anemia panel  Hypokalemia -replete  Code Status: full Family Communication: patient Disposition Plan:    Consultants:  cards  Procedures:  echo   HPI/Subjective: No complaints, talking on the phone  Objective: Filed Vitals:   01/08/14 0448  BP: 135/74  Pulse: 77  Temp: 98 F (36.7 C)  Resp: 18    Intake/Output Summary (Last 24 hours) at 01/08/14 0941 Last data filed at 01/08/14 0449  Gross per 24 hour  Intake    240 ml  Output    300 ml  Net    -60 ml   Filed Weights   01/06/14 2227 01/07/14 0537 01/08/14 0448  Weight: 121.02 kg (266 lb 12.8 oz) 120.974 kg (266 lb 11.2 oz) 122.653 kg (270 lb 6.4 oz)    Exam:   General:  A+Ox3, NAD  Cardiovascular: rrr  Respiratory: clear  Abdomen: +BS, soft  Musculoskeletal: no edema   Data Reviewed: Basic Metabolic Panel:  Recent Labs Lab  01/07/14 0440 01/08/14 0444  NA 139 139  K 3.4* 4.1  CL 102 103  CO2 29 22  GLUCOSE 120* 145*  BUN 10 9  CREATININE 0.87 0.85  CALCIUM 9.3 9.1   Liver Function Tests:  Recent Labs Lab 01/07/14 0440  AST 19  ALT 16  ALKPHOS 92  BILITOT 0.3  PROT 7.2  ALBUMIN 3.4*   No results for input(s): LIPASE, AMYLASE in the last 168 hours. No results for input(s): AMMONIA in the last 168 hours. CBC:  Recent Labs Lab 01/07/14 0440 01/08/14 0444  WBC 7.4 6.7  HGB 10.7* 10.8*  HCT 35.1* 35.5*  MCV 78.3 80.7  PLT 223 209   Cardiac Enzymes:  Recent Labs Lab 01/06/14 2348 01/07/14 0440 01/07/14 1031  TROPONINI <0.03 <0.03 <0.03   BNP (last 3 results) No results for input(s): PROBNP in the last 8760 hours. CBG:  Recent Labs Lab 01/07/14 0735 01/07/14 1142 01/07/14 1633 01/07/14 2121 01/08/14 0745  GLUCAP 135* 169* 123* 147* 144*    Recent Results (from the past 240 hour(s))  Culture, sputum-assessment     Status: None   Collection Time: 01/07/14  1:05 AM  Result Value Ref Range Status   Specimen Description SPUTUM  Final   Special Requests NONE  Final   Sputum evaluation   Final    THIS SPECIMEN IS ACCEPTABLE. RESPIRATORY CULTURE REPORT TO FOLLOW.   Report Status 01/07/2014  FINAL  Final  Culture, respiratory (NON-Expectorated)     Status: None (Preliminary result)   Collection Time: 01/07/14  1:05 AM  Result Value Ref Range Status   Specimen Description SPUTUM  Final   Special Requests NONE  Final   Gram Stain   Final    FEW WBC PRESENT,BOTH PMN AND MONONUCLEAR RARE SQUAMOUS EPITHELIAL CELLS PRESENT FEW GRAM POSITIVE COCCI IN PAIRS Performed at [facility]    Culture PENDING  Incomplete   Report Status PENDING  Incomplete     Studies: Dg Chest 2 View  01/07/2014   CLINICAL DATA:  Cough.  Chest pain.  Short of breath.  EXAM: CHEST  2 VIEW  COMPARISON:  None.  FINDINGS: Lateral view degraded by patient arm position. Moderate thoracic  spondylosis. Numerous leads and wires project over the chest. Degenerate changes in both glenohumeral joints. Midline trachea. Mild cardiomegaly. Aortic atherosclerosis. Mediastinal contours otherwise within normal limits. No pleural effusion or pneumothorax. No congestive failure. Clear lungs.  IMPRESSION: Cardiomegaly without congestive failure.  Aortic atherosclerosis.   Electronically Signed   By: Jeronimo Greaves M.D.   On: 01/07/2014 00:51   Ct Angio Chest Pe W/cm &/or Wo Cm  01/07/2014   CLINICAL DATA:  Pulmonary embolism. Palpitations and positive D-dimer.  EXAM: CT ANGIOGRAPHY CHEST WITH CONTRAST  TECHNIQUE: Multidetector CT imaging of the chest was performed using the standard protocol during bolus administration of intravenous contrast. Multiplanar CT image reconstructions and MIPs were obtained to evaluate the vascular anatomy.  CONTRAST:  74.66mL OMNIPAQUE IOHEXOL 350 MG/ML SOLN  COMPARISON:  None.  FINDINGS: THORACIC INLET/BODY WALL:  No acute abnormality.  MEDIASTINUM:  Cardiomegaly. No pericardial effusion. There is no convincing pulmonary embolism. Note that respiratory motion and soft tissue attenuation are limiting factors, with motion especially prominent at the right apex and at both bases. This particularly limits evaluation of subsegmental vessels. No acute aortic pathology. No adenopathy.  LUNG WINDOWS:  No consolidation.  No effusion.  No suspicious pulmonary nodule.  UPPER ABDOMEN:  No acute findings.  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  Review of the MIP images confirms the above findings.  IMPRESSION: 1. Negative for pulmonary embolism. Note that respiratory motion limits assessment at multiple levels, mainly of the subsegmental arterial tree. 2. Cardiomegaly without failure.   Electronically Signed   By: Tiburcio Pea M.D.   On: 01/07/2014 02:28    Scheduled Meds: . aspirin  325 mg Oral Daily  . atorvastatin  40 mg Oral  q1800  . diltiazem  180 mg Oral Daily  . heparin  5,000 Units Subcutaneous 3 times per day  . insulin aspart  0-9 Units Subcutaneous TID WC  . insulin glargine  25 Units Subcutaneous Daily  . irbesartan  150 mg Oral Daily  . levothyroxine  50 mcg Oral QAC breakfast  . pantoprazole  40 mg Oral Q1200  . sodium chloride  3 mL Intravenous Q12H   Continuous Infusions: . sodium chloride 100 mL/hr at 01/06/14 2344   Antibiotics Given (last 72 hours)    None      Principal Problem:   Palpitation Active Problems:   Anemia   Dizziness   Essential hypertension   Diabetes mellitus without complication   HLD (hyperlipidemia)   OSA (obstructive sleep apnea)   GERD (gastroesophageal reflux disease)   Hypothyroidism   Chest pain   Cough   Hypokalemia   SVT (supraventricular tachycardia)   DOE (dyspnea on exertion)  Time spent: 25 min    Marlin Canary  [facility] Pager [phone] If 7PM-7AM, please contact night-coverage at [email or web address], password [facility] 01/08/2014, 9:41 AM  LOS: 2 days

## 2014-01-08 NOTE — Progress Notes (Addendum)
SUBJECTIVE:  Doing well.  NO complaints  OBJECTIVE:   Vitals:   Filed Vitals:   01/07/14 0003 01/07/14 0537 01/07/14 2043 01/08/14 0448  BP: 170/94 136/78 130/70 135/74  Pulse: 78 90 79 77  Temp: 97.5 F (36.4 C) 98.4 F (36.9 C) 98.1 F (36.7 C) 98 F (36.7 C)  TempSrc: Oral Oral Oral Oral  Resp: Height:      Weight:  266 lb 11.2 oz (120.974 kg)  270 lb 6.4 oz (122.653 kg)  SpO2: 98% 96% 93% 96%   I&O's:   Intake/Output Summary (Last 24 hours) at 01/08/14 0756 Last data filed at 01/08/14 0449  Gross per 24 hour  Intake    240 ml  Output    600 ml  Net   -360 ml   TELEMETRY: Reviewed telemetry pt in NSR with PAC's and several short bursts of nonsustained atrial tachycardia on tele     PHYSICAL EXAM General: Well developed, well nourished, in no acute distress Head: Eyes PERRLA, No xanthomas.   Normal cephalic and atramatic  Lungs:   Clear bilaterally to auscultation and percussion. Heart:   HRRR S1 S2 Pulses are 2+ & equal. Abdomen: Bowel sounds are positive, abdomen soft and non-tender without masses  Extremities:   No clubbing, cyanosis or edema.  DP +1 Neuro: Alert and oriented X 3. Psych:  Good affect, responds appropriately   LABS: Basic Metabolic Panel:  Recent Labs  16/10/96 0440 01/08/14 0444  NA 139 139  K 3.4* 4.1  CL 102 103  CO2 29 22  GLUCOSE 120* 145*  BUN 10 9  CREATININE 0.87 0.85  CALCIUM 9.3 9.1   Liver Function Tests:  Recent Labs  01/07/14 0440  AST 19  ALT 16  ALKPHOS 92  BILITOT 0.3  PROT 7.2  ALBUMIN 3.4*   No results for input(s): LIPASE, AMYLASE in the last 72 hours. CBC:  Recent Labs  01/07/14 0440 01/08/14 0444  WBC 7.4 6.7  HGB 10.7* 10.8*  HCT 35.1* 35.5*  MCV 78.3 80.7  PLT 223 209   Cardiac Enzymes:  Recent Labs  01/06/14 2348 01/07/14 0440 01/07/14 1031  TROPONINI <0.03 <0.03 <0.03   BNP: Invalid input(s): POCBNP D-Dimer:  Recent Labs  01/06/14 2348  DDIMER 1.53*    Hemoglobin A1C:  Recent Labs  01/07/14 0440  HGBA1C 6.7*   Fasting Lipid Panel:  Recent Labs  01/07/14 0440  CHOL 123  HDL 31*  LDLCALC 80  TRIG 60  CHOLHDL 4.0   Thyroid Function Tests:  Recent Labs  01/07/14 0440  TSH 1.794   Anemia Panel:  Recent Labs  01/06/14 2348  VITAMINB12 937*  FOLATE >20.0  FERRITIN 95  TIBC 297  IRON 34*  RETICCTPCT 2.2   Coag Panel:   Lab Results  Component Value Date   INR 1.22 01/07/2014    RADIOLOGY: Dg Chest 2 View  01/07/2014   CLINICAL DATA:  Cough.  Chest pain.  Short of breath.  EXAM: CHEST  2 VIEW  COMPARISON:  None.  FINDINGS: Lateral view degraded by patient arm position. Moderate thoracic spondylosis. Numerous leads and wires project over the chest. Degenerate changes in both glenohumeral joints. Midline trachea. Mild cardiomegaly. Aortic atherosclerosis. Mediastinal contours otherwise within normal limits. No pleural effusion or pneumothorax. No congestive failure. Clear lungs.  IMPRESSION: Cardiomegaly without congestive failure.  Aortic atherosclerosis.   Electronically Signed   By: Jeronimo Greaves M.D.   On: 01/07/2014 00:51  Ct Angio Chest Pe W/cm &/or Wo Cm  01/07/2014   CLINICAL DATA:  Pulmonary embolism. Palpitations and positive D-dimer.  EXAM: CT ANGIOGRAPHY CHEST WITH CONTRAST  TECHNIQUE: Multidetector CT imaging of the chest was performed using the standard protocol during bolus administration of intravenous contrast. Multiplanar CT image reconstructions and MIPs were obtained to evaluate the vascular anatomy.  CONTRAST:  74.33mL OMNIPAQUE IOHEXOL 350 MG/ML SOLN  COMPARISON:  None.  FINDINGS: THORACIC INLET/BODY WALL:  No acute abnormality.  MEDIASTINUM:  Cardiomegaly. No pericardial effusion. There is no convincing pulmonary embolism. Note that respiratory motion and soft tissue attenuation are limiting factors, with motion especially prominent at the right apex and at both bases. This particularly limits  evaluation of subsegmental vessels. No acute aortic pathology. No adenopathy.  LUNG WINDOWS:  No consolidation.  No effusion.  No suspicious pulmonary nodule.  UPPER ABDOMEN:  No acute findings.  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  Review of the MIP images confirms the above findings.  IMPRESSION: 1. Negative for pulmonary embolism. Note that respiratory motion limits assessment at multiple levels, mainly of the subsegmental arterial tree. 2. Cardiomegaly without failure.   Electronically Signed   By: Tiburcio Pea M.D.   On: 01/07/2014 02:28    Assessment/Plan: Principal Problem:  Palpitation with SVT and nonsustained atrial tachycardia Active Problems:  Anemia  Dizziness  Essential hypertension  Diabetes mellitus without complication  HLD (hyperlipidemia)  OSA (obstructive sleep apnea)  GERD (gastroesophageal reflux disease)  Hypothyroidism  Chest pain - atypical and sharp.  Cardiac enzymes negative x 3.  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  Will make NPO for 2 day Lexiscan myoview due to reduced LVF on echo.  She had CP with her SVT but it was atypical and very sharp.  She also has been having DOE which could be an anginal equivalent but also could be due to underlying morbid obesity.   2.  Change Cardizem to CD  daily and increase as needed for breakthrough atrial tachycardia   Quintella Reichert, MD  01/08/2014  7:56 AM

## 2014-01-09 ENCOUNTER — Inpatient Hospital Stay (HOSPITAL_COMMUNITY): Payer: Medicare Other

## 2014-01-09 ENCOUNTER — Other Ambulatory Visit (HOSPITAL_COMMUNITY): Payer: Medicare Other

## 2014-01-09 DIAGNOSIS — R072 Precordial pain: Secondary | ICD-10-CM

## 2014-01-09 LAB — GLUCOSE, CAPILLARY
Glucose-Capillary: 112 mg/dL — ABNORMAL HIGH (ref 70–99)
Glucose-Capillary: 92 mg/dL (ref 70–99)
Glucose-Capillary: 131 mg/dL — ABNORMAL HIGH (ref 70–99)
Glucose-Capillary: 117 mg/dL — ABNORMAL HIGH (ref 70–99)

## 2014-01-09 MED ORDER — TECHNETIUM TC 99M SESTAMIBI GENERIC - CARDIOLITE
30.0000 | Freq: Once | INTRAVENOUS | Status: AC | PRN
  Administered 2014-01-09: 30 via INTRAVENOUS

## 2014-01-09 MED ORDER — REGADENOSON 0.4 MG/5ML IV SOLN
INTRAVENOUS | Status: AC
  Filled 2014-01-09: qty 5

## 2014-01-09 NOTE — Progress Notes (Signed)
Patient ID: Jeanette Hill, female   DOB: 1955-07-08, 60 y.o.   MRN: [id number]   SUBJECTIVE:  Doing well.  NO complaints  OBJECTIVE:   Vitals:   Filed Vitals:   01/08/14 1111 01/08/14 1529 01/08/14 2128 01/09/14 0614  BP: 139/83 122/61 145/86 131/79  Pulse:  72 77 81  Temp:  98.2 F (36.8 C) 98.2 F (36.8 C) 98.3 F (36.8 C)  TempSrc:  Oral Oral Oral  Resp:  18    Height:      Weight:    120.884 kg (266 lb 8 oz)  SpO2:  99% 97% 96%   I&O's:    Intake/Output Summary (Last 24 hours) at 01/09/14 0900 Last data filed at 01/08/14 2030  Gross per 24 hour  Intake    243 ml  Output    700 ml  Net   -457 ml   TELEMETRY: Reviewed telemetry pt in NSR with PAC's and several short bursts of nonsustained atrial tachycardia on tele     PHYSICAL EXAM General: Well developed, well nourished, in no acute distress Head: Eyes PERRLA, No xanthomas.   Normal cephalic and atramatic  Lungs:   Clear bilaterally to auscultation and percussion. Heart:   HRRR S1 S2 Pulses are 2+ & equal. Abdomen: Bowel sounds are positive, abdomen soft and non-tender without masses  Extremities:   No clubbing, cyanosis or edema.  DP +1 Neuro: Alert and oriented X 3. Psych:  Good affect, responds appropriately   LABS: Basic Metabolic Panel:  Recent Labs  40/98/11 0440 01/08/14 0444  NA 139 139  K 3.4* 4.1  CL 102 103  CO2 29 22  GLUCOSE 120* 145*  BUN 10 9  CREATININE 0.87 0.85  CALCIUM 9.3 9.1   Liver Function Tests:  Recent Labs  01/07/14 0440  AST 19  ALT 16  ALKPHOS 92  BILITOT 0.3  PROT 7.2  ALBUMIN 3.4*   No results for input(s): LIPASE, AMYLASE in the last 72 hours. CBC:  Recent Labs  01/07/14 0440 01/08/14 0444  WBC 7.4 6.7  HGB 10.7* 10.8*  HCT 35.1* 35.5*  MCV 78.3 80.7  PLT 223 209   Cardiac Enzymes:  Recent Labs  01/06/14 2348 01/07/14 0440 01/07/14 1031  TROPONINI <0.03 <0.03 <0.03   BNP: Invalid input(s): POCBNP D-Dimer:  Recent Labs  01/06/14 2348    DDIMER 1.53*   Hemoglobin A1C:  Recent Labs  01/07/14 0440  HGBA1C 6.7*   Fasting Lipid Panel:  Recent Labs  01/07/14 0440  CHOL 123  HDL 31*  LDLCALC 80  TRIG 60  CHOLHDL 4.0   Thyroid Function Tests:  Recent Labs  01/07/14 0440  TSH 1.794   Anemia Panel:  Recent Labs  01/06/14 2348  VITAMINB12 937*  FOLATE >20.0  FERRITIN 95  TIBC 297  IRON 34*  RETICCTPCT 2.2   Coag Panel:   Lab Results  Component Value Date   INR 1.22 01/07/2014    RADIOLOGY: Dg Chest 2 View  01/07/2014   CLINICAL DATA:  Cough.  Chest pain.  Short of breath.  EXAM: CHEST  2 VIEW  COMPARISON:  None.  FINDINGS: Lateral view degraded by patient arm position. Moderate thoracic spondylosis. Numerous leads and wires project over the chest. Degenerate changes in both glenohumeral joints. Midline trachea. Mild cardiomegaly. Aortic atherosclerosis. Mediastinal contours otherwise within normal limits. No pleural effusion or pneumothorax. No congestive failure. Clear lungs.  IMPRESSION: Cardiomegaly without congestive failure.  Aortic atherosclerosis.   Electronically Signed  By: Jeronimo Greaves M.D.   On: 01/07/2014 00:51   Ct Angio Chest Pe W/cm &/or Wo Cm  01/07/2014   CLINICAL DATA:  Pulmonary embolism. Palpitations and positive D-dimer.  EXAM: CT ANGIOGRAPHY CHEST WITH CONTRAST  TECHNIQUE: Multidetector CT imaging of the chest was performed using the standard protocol during bolus administration of intravenous contrast. Multiplanar CT image reconstructions and MIPs were obtained to evaluate the vascular anatomy.  CONTRAST:  74.24mL OMNIPAQUE IOHEXOL 350 MG/ML SOLN  COMPARISON:  None.  FINDINGS: THORACIC INLET/BODY WALL:  No acute abnormality.  MEDIASTINUM:  Cardiomegaly. No pericardial effusion. There is no convincing pulmonary embolism. Note that respiratory motion and soft tissue attenuation are limiting factors, with motion especially prominent at the right apex and at both bases. This particularly  limits evaluation of subsegmental vessels. No acute aortic pathology. No adenopathy.  LUNG WINDOWS:  No consolidation.  No effusion.  No suspicious pulmonary nodule.  UPPER ABDOMEN:  No acute findings.  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  Review of the MIP images confirms the above findings.  IMPRESSION: 1. Negative for pulmonary embolism. Note that respiratory motion limits assessment at multiple levels, mainly of the subsegmental arterial tree. 2. Cardiomegaly without failure.   Electronically Signed   By: Tiburcio Pea M.D.   On: 01/07/2014 02:28    Assessment/Plan: Principal Problem:  Palpitation with SVT and nonsustained atrial tachycardia Active Problems:  Anemia  Dizziness  Essential hypertension  Diabetes mellitus without complication  HLD (hyperlipidemia)  OSA (obstructive sleep apnea)  GERD (gastroesophageal reflux disease)  Hypothyroidism  Chest pain - atypical and sharp.  Cardiac enzymes negative x 3.  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   2 day Lexiscan myoview ordered by Dr Mayford Knife  due to reduced LVF on echo.  She had CP with her SVT but it was atypical and very sharp.  She also has been having DOE which could be an anginal equivalent but also could be due to underlying morbid obesity.   2.  Changed Cardizem to CD  daily and increase as needed for breakthrough atrial tachycardia  Likely d/c late tomorrow if myovue low risk    Charlton Haws, MD  01/09/2014  9:00 AM

## 2014-01-09 NOTE — Progress Notes (Signed)
PROGRESS NOTE  Jeanette Hill:[id number] DOB: 28-Jul-1955 DOA: 01/06/2014 PCP: Alain Honey, PA-C  Assessment/Plan: Palpitation:  -SVT which was converted to sinus rhythm after treated with Cardizem.  - trop x 3 neg - CXR-2 view: cardiomegaly - 2 d echo- EF 45 % - lipitor, ASA, prn NTG and morphine - Cardizem long acting - D-dimer + but CTA negative - check A1c, FLP and TSH -for 2 day stress test- finished tomm Need outpatient OSA eval  Chest pain: Her significant risk factors, we'll need to rule out ACS -trop x 3  Cough: mild cough, likely due to viral infection. -Robitussin  Hypertension: Patient was on amlodipine and Benicar/HCTC at home. -will hold amlodipine since patient was started Cardizem -switch benicar to -sartan  Diabetes mellitus: Patient was on S, metformin and glipizide at home. - switch oral meds to sliding scale insulin  Hypothyroidism: Patient is on Synthroid 50 mcg at home  - continue Synthroid -tsh ok  Hyperlipidemia: Patient was on Simvastatin at home  - switch to high potent Lipitor 40 mg daily  GERD: -Protonix  Anemia:  -check anemia panel  Hypokalemia -replete  Code Status: full Family Communication: patient Disposition Plan: home in PM tomm if ST negative   Consultants:  cards  Procedures:  echo   HPI/Subjective: No complaints, talking on the phone  Objective: Filed Vitals:   01/09/14 0951  BP: 131/96  Pulse:   Temp:   Resp:     Intake/Output Summary (Last 24 hours) at 01/09/14 1235 Last data filed at 01/09/14 0900  Gross per 24 hour  Intake    480 ml  Output      0 ml  Net    480 ml   Filed Weights   01/07/14 0537 01/08/14 0448 01/09/14 0614  Weight: 120.974 kg (266 lb 11.2 oz) 122.653 kg (270 lb 6.4 oz) 120.884 kg (266 lb 8 oz)    Exam:   General:  A+Ox3, NAD- no increased work of breathing  Cardiovascular: rrr  Respiratory: clear  Abdomen: +BS, soft  Musculoskeletal: no edema   Data  Reviewed: Basic Metabolic Panel:  Recent Labs Lab 01/07/14 0440 01/08/14 0444  NA 139 139  K 3.4* 4.1  CL 102 103  CO2 29 22  GLUCOSE 120* 145*  BUN 10 9  CREATININE 0.87 0.85  CALCIUM 9.3 9.1   Liver Function Tests:  Recent Labs Lab 01/07/14 0440  AST 19  ALT 16  ALKPHOS 92  BILITOT 0.3  PROT 7.2  ALBUMIN 3.4*   No results for input(s): LIPASE, AMYLASE in the last 168 hours. No results for input(s): AMMONIA in the last 168 hours. CBC:  Recent Labs Lab 01/07/14 0440 01/08/14 0444  WBC 7.4 6.7  HGB 10.7* 10.8*  HCT 35.1* 35.5*  MCV 78.3 80.7  PLT 223 209   Cardiac Enzymes:  Recent Labs Lab 01/06/14 2348 01/07/14 0440 01/07/14 1031  TROPONINI <0.03 <0.03 <0.03   BNP (last 3 results) No results for input(s): PROBNP in the last 8760 hours. CBG:  Recent Labs Lab 01/08/14 1133 01/08/14 1628 01/08/14 2126 01/09/14 0749 01/09/14 1147  GLUCAP 134* 97 118* 112* 92    Recent Results (from the past 240 hour(s))  Culture, sputum-assessment     Status: None   Collection Time: 01/07/14  1:05 AM  Result Value Ref Range Status   Specimen Description SPUTUM  Final   Special Requests NONE  Final   Sputum evaluation   Final    THIS SPECIMEN  IS ACCEPTABLE. RESPIRATORY CULTURE REPORT TO FOLLOW.   Report Status 01/07/2014 FINAL  Final  Culture, respiratory (NON-Expectorated)     Status: None (Preliminary result)   Collection Time: 01/07/14  1:05 AM  Result Value Ref Range Status   Specimen Description SPUTUM  Final   Special Requests NONE  Final   Gram Stain   Final    FEW WBC PRESENT,BOTH PMN AND MONONUCLEAR RARE SQUAMOUS EPITHELIAL CELLS PRESENT FEW GRAM POSITIVE COCCI IN PAIRS Performed at [facility]    Culture PENDING  Incomplete   Report Status PENDING  Incomplete     Studies: No results found.  Scheduled Meds: . aspirin  325 mg Oral Daily  . atorvastatin  40 mg Oral q1800  . diltiazem  180 mg Oral Daily  . heparin  5,000 Units  Subcutaneous 3 times per day  . insulin aspart  0-9 Units Subcutaneous TID WC  . insulin glargine  25 Units Subcutaneous Daily  . irbesartan  150 mg Oral Daily  . levothyroxine  50 mcg Oral QAC breakfast  . pantoprazole  40 mg Oral Q1200  . sodium chloride  3 mL Intravenous Q12H   Continuous Infusions: . sodium chloride 100 mL/hr at 01/06/14 2344   Antibiotics Given (last 72 hours)    None      Principal Problem:   Palpitation Active Problems:   Anemia   Dizziness   Essential hypertension   Diabetes mellitus without complication   HLD (hyperlipidemia)   OSA (obstructive sleep apnea)   GERD (gastroesophageal reflux disease)   Hypothyroidism   Chest pain   Cough   Hypokalemia   SVT (supraventricular tachycardia)   DOE (dyspnea on exertion)    Time spent: 25 min    Disney Ruggiero  [facility] Pager [phone] If 7PM-7AM, please contact night-coverage at [email or web address], password [facility] 01/09/2014, 12:35 PM  LOS: 3 days

## 2014-01-10 ENCOUNTER — Inpatient Hospital Stay (HOSPITAL_COMMUNITY): Payer: Medicare Other

## 2014-01-10 DIAGNOSIS — I471 Supraventricular tachycardia: Principal | ICD-10-CM

## 2014-01-10 DIAGNOSIS — R079 Chest pain, unspecified: Secondary | ICD-10-CM

## 2014-01-10 LAB — CULTURE, RESPIRATORY: CULTURE: NORMAL

## 2014-01-10 LAB — GLUCOSE, CAPILLARY
GLUCOSE-CAPILLARY: 131 mg/dL — AB (ref 70–99)
GLUCOSE-CAPILLARY: 101 mg/dL — AB (ref 70–99)
Glucose-Capillary: 156 mg/dL — ABNORMAL HIGH (ref 70–99)
Glucose-Capillary: 117 mg/dL — ABNORMAL HIGH (ref 70–99)

## 2014-01-10 LAB — CULTURE, RESPIRATORY W GRAM STAIN

## 2014-01-10 MED ORDER — REGADENOSON 0.4 MG/5ML IV SOLN
0.4000 mg | Freq: Once | INTRAVENOUS | Status: AC
  Administered 2014-01-10: 0.4 mg via INTRAVENOUS
  Filled 2014-01-10: qty 5

## 2014-01-10 MED ORDER — TECHNETIUM TC 99M SESTAMIBI GENERIC - CARDIOLITE
30.0000 | Freq: Once | INTRAVENOUS | Status: AC | PRN
  Administered 2014-01-10: 30 via INTRAVENOUS

## 2014-01-10 MED ORDER — REGADENOSON 0.4 MG/5ML IV SOLN
INTRAVENOUS | Status: AC
  Filled 2014-01-10: qty 5

## 2014-01-10 MED ORDER — SODIUM CHLORIDE 0.9 % IJ SOLN: 3.0000 mL | Freq: Two times a day (BID) | INTRAMUSCULAR | Status: DC

## 2014-01-10 MED ORDER — SODIUM CHLORIDE 0.9 % IV SOLN: 250.0000 mL | INTRAVENOUS | Status: DC | PRN

## 2014-01-10 MED ORDER — SODIUM CHLORIDE 0.9 % IJ SOLN: 3.0000 mL | INTRAMUSCULAR | Status: DC | PRN

## 2014-01-10 MED ORDER — ASPIRIN 81 MG PO CHEW
81.0000 mg | CHEWABLE_TABLET | ORAL | Status: AC
  Administered 2014-01-11: 81 mg via ORAL
  Filled 2014-01-10: qty 1

## 2014-01-10 NOTE — Progress Notes (Signed)
Patient ID: Jeanette Hill, female   DOB: 07-22-1955, 59 y.o.   MRN: [id number] Reviewed myovue:  IMPRESSION: 1. Moderate sized area of Low anterior wall ischemia.  2. Global hypokinesis with anterior wall motion abnormality.  3. Left ventricular ejection fraction 29%  4. High-risk stress test findings*.  EF also down by echo   Will arrange cath tomorrow Discussed with patient willing to proceed Orders done and put on cath board  Charlton Haws

## 2014-01-10 NOTE — Progress Notes (Signed)
Patient ID: Jeanette Hill, female   DOB: 09/17/1955, 58 y.o.   MRN: [id number]   SUBJECTIVE:  Doing well.  NO complaints 2nd portion of myovue today   OBJECTIVE:   Vitals:   Filed Vitals:   01/09/14 0951 01/09/14 1403 01/09/14 2139 01/10/14 0715  BP: 131/96 100/81 137/82 143/71  Pulse:  72 77 77  Temp:   98.3 F (36.8 C) 98.3 F (36.8 C)  TempSrc:   Oral Oral  Resp:  18    Height:      Weight:    119.568 kg (263 lb 9.6 oz)  SpO2:  95% 96% 96%    TELEMETRY: Reviewed telemetry pt in NSR with PAC's and several short bursts of nonsustained atrial tachycardia on tele   PHYSICAL EXAM General: morbidly obese black female  Head: Eyes PERRLA, No xanthomas.   Normal cephalic and atramatic  Lungs:   Clear bilaterally to auscultation and percussion. Heart:   HRRR S1 S2 Pulses are 2+ & equal. Abdomen: Bowel sounds are positive, abdomen soft and non-tender without masses  Extremities:   No clubbing, cyanosis or edema.  DP +1 Neuro: Alert and oriented X 3. Psych:  Good affect, responds appropriately   LABS: Basic Metabolic Panel:  Recent Labs  01/08/14 0444  NA 139  K 4.1  CL 103  CO2 22  GLUCOSE 145*  BUN 9  CREATININE 0.85  CALCIUM 9.1    CBC:  Recent Labs  01/08/14 0444  WBC 6.7  HGB 10.8*  HCT 35.5*  MCV 80.7  PLT 209   Cardiac Enzymes:  Recent Labs  01/07/14 1031  TROPONINI <0.03   Coag Panel:   Lab Results  Component Value Date   INR 1.22 01/07/2014    RADIOLOGY: Dg Chest 2 View  01/07/2014   CLINICAL DATA:  Cough.  Chest pain.  Short of breath.  EXAM: CHEST  2 VIEW  COMPARISON:  None.  FINDINGS: Lateral view degraded by patient arm position. Moderate thoracic spondylosis. Numerous leads and wires project over the chest. Degenerate changes in both glenohumeral joints. Midline trachea. Mild cardiomegaly. Aortic atherosclerosis. Mediastinal contours otherwise within normal limits. No pleural effusion or pneumothorax. No congestive failure. Clear lungs.   IMPRESSION: Cardiomegaly without congestive failure.  Aortic atherosclerosis.   Electronically Signed   By: Kyle  Talbot M.D.   On: 01/07/2014 00:51   Ct Angio Chest Pe W/cm &/or Wo Cm  01/07/2014   CLINICAL DATA:  Pulmonary embolism. Palpitations and positive D-dimer.  EXAM: CT ANGIOGRAPHY CHEST WITH CONTRAST  TECHNIQUE: Multidetector CT imaging of the chest was performed using the standard protocol during bolus administration of intravenous contrast. Multiplanar CT image reconstructions and MIPs were obtained to evaluate the vascular anatomy.  CONTRAST:  74.5mL OMNIPAQUE IOHEXOL 350 MG/ML SOLN  COMPARISON:  None.  FINDINGS: THORACIC INLET/BODY WALL:  No acute abnormality.  MEDIASTINUM:  Cardiomegaly. No pericardial effusion. There is no convincing pulmonary embolism. Note that respiratory motion and soft tissue attenuation are limiting factors, with motion especially prominent at the right apex and at both bases. This particularly limits evaluation of subsegmental vessels. No acute aortic pathology. No adenopathy.  LUNG WINDOWS:  No consolidation.  No effusion.  No suspicious pulmonary nodule.  UPPER ABDOMEN:  No acute findings.  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  Review of the MIP images confirms the above findings.  IMPRESSION: 1. Negative for pulmonary embolism. Note that respiratory motion limits assessment at multiple levels, mainly of the subsegmental arterial   tree. 2. Cardiomegaly without failure.   Electronically Signed   By: Tiburcio Pea M.D.   On: 01/07/2014 02:28    Assessment/Plan: Principal Problem:  Palpitation with SVT and nonsustained atrial tachycardia Active Problems:  Anemia  Dizziness  Essential hypertension  Diabetes mellitus without complication  HLD (hyperlipidemia)  OSA (obstructive sleep apnea)  GERD (gastroesophageal reflux disease)  Hypothyroidism  Chest pain - atypical and sharp.  Cardiac enzymes  negative x 3.  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   2 day Lexiscan myoview ordered by Dr Mayford Knife  due to reduced LVF on echo.  She had CP with her SVT but it was atypical and very sharp.  She also has been having DOE which could be an anginal equivalent but also could be due to underlying morbid obesity.   2.  Changed Cardizem to CD  daily and increase as needed for breakthrough atrial tachycardia  Likely d/c late today  if myovue low risk    Charlton Haws, MD  01/10/2014  9:08 AM

## 2014-01-10 NOTE — Progress Notes (Addendum)
PROGRESS NOTE  Jeanette Hill WJX:[id number] DOB: [birth year] DOA: 01/06/2014 PCP: Alain Honey, PA-C  Patient was admitted after SVT episode.  High risk stress test and plan for cath on Monday   Assessment/Plan: Palpitation:  -SVT which was converted to sinus rhythm after treated with Cardizem.  - trop x 3 neg - CXR-2 view: cardiomegaly - 2 d echo- EF 45 % - lipitor, ASA, prn NTG and morphine - Cardizem long acting - D-dimer + but CTA negative - check A1c, FLP and TSH -high risk stress test- notified cards Need outpatient OSA eval  Chest pain: Her significant risk factors, we'll need to rule out ACS -trop x 3  Cough: mild cough, likely due to viral infection. -Robitussin  Hypertension: Patient was on amlodipine and Benicar/HCTC at home. -will hold amlodipine since patient was started Cardizem -switch benicar to -sartan  Diabetes mellitus: Patient was on S, metformin and glipizide at home. - switch oral meds to sliding scale insulin  Hypothyroidism: Patient is on Synthroid 50 mcg at home  - continue Synthroid -tsh ok  Hyperlipidemia: Patient was on Simvastatin at home  - switch to high potent Lipitor 40 mg daily  GERD: -Protonix  Anemia:  -check anemia panel  Hypokalemia -replete  Code Status: full Family Communication: patient Disposition Plan: home in PM tomm if ST negative   Consultants:  cards  Procedures:  echo   HPI/Subjective: No complaints  Objective: Filed Vitals:   01/10/14 1004  BP: 157/90  Pulse: 94  Temp:   Resp:    No intake or output data in the 24 hours ending 01/10/14 1304 Filed Weights   01/08/14 0448 01/09/14 0614 01/10/14 0715  Weight: 122.653 kg (270 lb 6.4 oz) 120.884 kg (266 lb 8 oz) 119.568 kg (263 lb 9.6 oz)    Exam:   General:  A+Ox3, NAD- no increased work of breathing  Cardiovascular: rrr  Respiratory: clear  Abdomen: +BS, soft  Musculoskeletal: no edema   Data Reviewed: Basic Metabolic  Panel:  Recent Labs Lab 01/07/14 0440 01/08/14 0444  NA 139 139  K 3.4* 4.1  CL 102 103  CO2 29 22  GLUCOSE 120* 145*  BUN 10 9  CREATININE 0.87 0.85  CALCIUM 9.3 9.1   Liver Function Tests:  Recent Labs Lab 01/07/14 0440  AST 19  ALT 16  ALKPHOS 92  BILITOT 0.3  PROT 7.2  ALBUMIN 3.4*   No results for input(s): LIPASE, AMYLASE in the last 168 hours. No results for input(s): AMMONIA in the last 168 hours. CBC:  Recent Labs Lab 01/07/14 0440 01/08/14 0444  WBC 7.4 6.7  HGB 10.7* 10.8*  HCT 35.1* 35.5*  MCV 78.3 80.7  PLT 223 209   Cardiac Enzymes:  Recent Labs Lab 01/06/14 2348 01/07/14 0440 01/07/14 1031  TROPONINI <0.03 <0.03 <0.03   BNP (last 3 results) No results for input(s): PROBNP in the last 8760 hours. CBG:  Recent Labs Lab 01/09/14 1147 01/09/14 1637 01/09/14 2137 01/10/14 0743 01/10/14 1149  GLUCAP 92 131* 117* 131* 156*    Recent Results (from the past 240 hour(s))  Culture, sputum-assessment     Status: None   Collection Time: 01/07/14  1:05 AM  Result Value Ref Range Status   Specimen Description SPUTUM  Final   Special Requests NONE  Final   Sputum evaluation   Final    THIS SPECIMEN IS ACCEPTABLE. RESPIRATORY CULTURE REPORT TO FOLLOW.   Report Status 01/07/2014 FINAL  Final  Culture, respiratory (NON-Expectorated)  Status: None   Collection Time: 01/07/14  1:05 AM  Result Value Ref Range Status   Specimen Description SPUTUM  Final   Special Requests NONE  Final   Gram Stain   Final    FEW WBC PRESENT,BOTH PMN AND MONONUCLEAR RARE SQUAMOUS EPITHELIAL CELLS PRESENT FEW GRAM POSITIVE COCCI IN PAIRS Performed at [facility]    Culture   Final    NORMAL OROPHARYNGEAL FLORA Performed at [facility]    Report Status 01/10/2014 FINAL  Final     Studies: Nm Myocar Multi W/spect W/wall Motion / Ef  01/10/2014   CLINICAL DATA:  Chest pain and dyspnea on exertion.  EXAM: MYOCARDIAL IMAGING WITH SPECT  (REST AND PHARMACOLOGIC-STRESS - 2 DAY PROTOCOL)  GATED LEFT VENTRICULAR WALL MOTION STUDY  LEFT VENTRICULAR EJECTION FRACTION  TECHNIQUE: Standard myocardial SPECT imaging was performed after resting intravenous injection of 30 mCi Tc-44m sestamibi. Subsequently, on a second day, intravenous infusion of Lexiscan was performed under the supervision of the Cardiology staff. At peak effect of the drug, 30 mCi Tc-59m sestamibi was injected intravenously and standard myocardial SPECT imaging was performed. Quantitative gated imaging was also performed to evaluate left ventricular wall motion, and estimate left ventricular ejection fraction.  COMPARISON:  Chest CT 01/07/2014  FINDINGS: Perfusion: There is decreased activity in the low anterior wall on the stress images. This appears normal on the rest images and highly suspicious for ischemia. No fixed defects to suggest scar/infarction.  Wall Motion: Global hypokinesis and anterior wall motion abnormality.  Left Ventricular Ejection Fraction: 29 %  End diastolic volume 100 into ml  End systolic volume 73 ml  IMPRESSION: 1. Moderate sized area of Low anterior wall ischemia.  2. Global hypokinesis with anterior wall motion abnormality.  3. Left ventricular ejection fraction 29%  4. High-risk stress test findings*.  These results will be called to the ordering clinician or representative by the Radiologist Assistant, and communication documented in the PACS or zVision Dashboard.  *2012 Appropriate Use Criteria for Coronary Revascularization Focused Update: J Am Coll Cardiol. 2012;59(9):857-881. http://content.[email or web address].aspx?articleid=1201161   Electronically Signed   By: Loralie Champagne M.D.   On: 01/10/2014 12:55    Scheduled Meds: . aspirin  325 mg Oral Daily  . atorvastatin  40 mg Oral q1800  . diltiazem  180 mg Oral Daily  . heparin  5,000 Units Subcutaneous 3 times per day  . insulin aspart  0-9 Units Subcutaneous TID WC  . insulin glargine  25  Units Subcutaneous Daily  . irbesartan  150 mg Oral Daily  . levothyroxine  50 mcg Oral QAC breakfast  . pantoprazole  40 mg Oral Q1200  . sodium chloride  3 mL Intravenous Q12H   Continuous Infusions: . sodium chloride 100 mL/hr at 01/06/14 2344   Antibiotics Given (last 72 hours)    None      Principal Problem:   Palpitation Active Problems:   Anemia   Dizziness   Essential hypertension   Diabetes mellitus without complication   HLD (hyperlipidemia)   OSA (obstructive sleep apnea)   GERD (gastroesophageal reflux disease)   Hypothyroidism   Chest pain   Cough   Hypokalemia   SVT (supraventricular tachycardia)   DOE (dyspnea on exertion)    Time spent: 25 min    Corderius Saraceni  [facility] Pager [phone] If 7PM-7AM, please contact night-coverage at [email or web address], password [facility] 01/10/2014, 1:04 PM  LOS: 4 days

## 2014-01-10 NOTE — Progress Notes (Signed)
Results of stress test called to Dr. Benjamine Mola

## 2014-01-10 NOTE — Progress Notes (Signed)
Part 2 of 2 day lexiscan stress test completed without complication, pending result by [facility]  Signed, Azalee Course PA Pager: [phone]

## 2014-01-11 ENCOUNTER — Encounter (HOSPITAL_COMMUNITY)
Admission: EM | Disposition: A | Discharge status: Home / Self Care | Payer: Self-pay | Source: Other Acute Inpatient Hospital | Attending: Internal Medicine

## 2014-01-11 ENCOUNTER — Encounter (HOSPITAL_COMMUNITY): Payer: Self-pay | Admitting: Interventional Cardiology

## 2014-01-11 DIAGNOSIS — R0789 Other chest pain: Secondary | ICD-10-CM

## 2014-01-11 DIAGNOSIS — R9439 Abnormal result of other cardiovascular function study: Secondary | ICD-10-CM | POA: Insufficient documentation

## 2014-01-11 DIAGNOSIS — R931 Abnormal findings on diagnostic imaging of heart and coronary circulation: Secondary | ICD-10-CM

## 2014-01-11 DIAGNOSIS — R0609 Other forms of dyspnea: Secondary | ICD-10-CM

## 2014-01-11 HISTORY — PX: LEFT HEART CATHETERIZATION WITH CORONARY ANGIOGRAM: SHX5451

## 2014-01-11 LAB — RESPIRATORY VIRUS PANEL
Adenovirus: NOT DETECTED
Influenza A H1: NOT DETECTED
Influenza A H3: NOT DETECTED
Influenza A: NOT DETECTED
Influenza B: NOT DETECTED
Metapneumovirus: NOT DETECTED
PARAINFLUENZA 1 A: NOT DETECTED
Parainfluenza 2: NOT DETECTED
Parainfluenza 3: NOT DETECTED
Respiratory Syncytial Virus A: NOT DETECTED
Respiratory Syncytial Virus B: NOT DETECTED
Rhinovirus: NOT DETECTED

## 2014-01-11 LAB — GLUCOSE, CAPILLARY
GLUCOSE-CAPILLARY: 106 mg/dL — AB (ref 70–99)
GLUCOSE-CAPILLARY: 138 mg/dL — AB (ref 70–99)

## 2014-01-11 SURGERY — LEFT HEART CATHETERIZATION WITH CORONARY ANGIOGRAM
Anesthesia: LOCAL

## 2014-01-11 MED ORDER — METFORMIN HCL ER 500 MG PO TB24
500.0000 mg | ORAL_TABLET | Freq: Two times a day (BID) | ORAL | Status: DC
  Filled 2014-01-11 (×3): qty 1

## 2014-01-11 MED ORDER — METOPROLOL TARTRATE 50 MG PO TABS: 50.0000 mg | ORAL_TABLET | Freq: Two times a day (BID) | ORAL | Status: AC

## 2014-01-11 MED ORDER — OLMESARTAN MEDOXOMIL-HCTZ 20-12.5 MG PO TABS: 1.0000 | ORAL_TABLET | Freq: Every day | ORAL | Status: DC

## 2014-01-11 MED ORDER — ASPIRIN 81 MG PO CHEW: 81.0000 mg | CHEWABLE_TABLET | Freq: Every day | ORAL | Status: DC

## 2014-01-11 MED ORDER — LEVOTHYROXINE SODIUM 50 MCG PO TABS: 50.0000 ug | ORAL_TABLET | Freq: Every day | ORAL | Status: DC

## 2014-01-11 MED ORDER — ATORVASTATIN CALCIUM 40 MG PO TABS: 40.0000 mg | ORAL_TABLET | Freq: Every day | ORAL | Status: DC

## 2014-01-11 MED ORDER — INSULIN GLARGINE 100 UNIT/ML ~~LOC~~ SOLN
25.0000 [IU] | Freq: Every morning | SUBCUTANEOUS | Status: DC
  Administered 2014-01-11: 25 [IU] via SUBCUTANEOUS
  Filled 2014-01-11: qty 0.25

## 2014-01-11 MED ORDER — HEPARIN (PORCINE) IN NACL 2-0.9 UNIT/ML-% IJ SOLN
INTRAMUSCULAR | Status: AC
  Filled 2014-01-11: qty 1000

## 2014-01-11 MED ORDER — IRBESARTAN 150 MG PO TABS
150.0000 mg | ORAL_TABLET | Freq: Every day | ORAL | Status: DC
  Administered 2014-01-11: 150 mg via ORAL
  Filled 2014-01-11: qty 1

## 2014-01-11 MED ORDER — LIDOCAINE HCL (PF) 1 % IJ SOLN
INTRAMUSCULAR | Status: AC
  Filled 2014-01-11: qty 30

## 2014-01-11 MED ORDER — METOPROLOL TARTRATE 25 MG PO TABS
25.0000 mg | ORAL_TABLET | Freq: Two times a day (BID) | ORAL | Status: DC
  Administered 2014-01-11: 25 mg via ORAL
  Filled 2014-01-11: qty 1

## 2014-01-11 MED ORDER — TRAMADOL HCL 50 MG PO TABS: 50.0000 mg | ORAL_TABLET | ORAL | Status: DC | PRN

## 2014-01-11 MED ORDER — HYDROCHLOROTHIAZIDE 12.5 MG PO CAPS
12.5000 mg | ORAL_CAPSULE | Freq: Every day | ORAL | Status: DC
  Administered 2014-01-11: 12.5 mg via ORAL
  Filled 2014-01-11: qty 1

## 2014-01-11 MED ORDER — HEPARIN SODIUM (PORCINE) 1000 UNIT/ML IJ SOLN
INTRAMUSCULAR | Status: AC
  Filled 2014-01-11: qty 1

## 2014-01-11 MED ORDER — GLIPIZIDE 5 MG PO TABS: 5.0000 mg | ORAL_TABLET | Freq: Every day | ORAL | Status: DC

## 2014-01-11 MED ORDER — MIDAZOLAM HCL 2 MG/2ML IJ SOLN
INTRAMUSCULAR | Status: AC
  Filled 2014-01-11: qty 2

## 2014-01-11 MED ORDER — VERAPAMIL HCL 2.5 MG/ML IV SOLN
INTRAVENOUS | Status: AC
  Filled 2014-01-11: qty 2

## 2014-01-11 MED ORDER — PANTOPRAZOLE SODIUM 40 MG PO TBEC
40.0000 mg | DELAYED_RELEASE_TABLET | Freq: Every day | ORAL | Status: DC
  Administered 2014-01-11: 40 mg via ORAL
  Filled 2014-01-11: qty 1

## 2014-01-11 MED ORDER — GABAPENTIN 300 MG PO CAPS: 300.0000 mg | ORAL_CAPSULE | Freq: Every day | ORAL | Status: DC

## 2014-01-11 MED ORDER — ONDANSETRON HCL 4 MG/2ML IJ SOLN: 4.0000 mg | Freq: Four times a day (QID) | INTRAMUSCULAR | Status: DC | PRN

## 2014-01-11 MED ORDER — ACETAMINOPHEN 325 MG PO TABS
650.0000 mg | ORAL_TABLET | ORAL | Status: DC | PRN
Start: 2014-01-11 — End: 2014-01-11

## 2014-01-11 MED ORDER — FENTANYL CITRATE 0.05 MG/ML IJ SOLN
INTRAMUSCULAR | Status: AC
  Filled 2014-01-11: qty 2

## 2014-01-11 MED ORDER — SODIUM CHLORIDE 0.9 % IV SOLN: INTRAVENOUS | Status: AC

## 2014-01-11 MED ORDER — LORATADINE 10 MG PO TABS
10.0000 mg | ORAL_TABLET | Freq: Every day | ORAL | Status: DC
  Administered 2014-01-11: 10 mg via ORAL
  Filled 2014-01-11: qty 1

## 2014-01-11 NOTE — Care Management Note (Signed)
    Page 1 of 1   01/11/2014     2:10:20 PM CARE MANAGEMENT NOTE 01/11/2014  Patient:  Jeanette Hill, Jeanette Hill   Account Number:  [id number]  Date Initiated:  01/11/2014  Documentation initiated by:  GRAVES-BIGELOW,Kelseigh Diver  Subjective/Objective Assessment:   Pt admitted for cp and SVT. Sp stress test-abnormal and s/p cath 01-11-13.     Action/Plan:   No needs from CM at this time.   Anticipated DC Date:  01/11/2014   Anticipated DC Plan:  HOME/SELF CARE      DC Planning Services  CM consult      Choice offered to / List presented to:             Status of service:  Completed, signed off Medicare Important Message given?  YES (If response is "NO", the following Medicare IM given date fields will be blank) Date Medicare IM given:  01/11/2014 Medicare IM given by:  GRAVES-BIGELOW,Kjersti Dittmer Date Additional Medicare IM given:   Additional Medicare IM given by:    Discharge Disposition:  HOME/SELF CARE  Per UR Regulation:  Reviewed for med. necessity/level of care/duration of stay  If discussed at Long Length of Stay Meetings, dates discussed:   01/12/2014    Comments:

## 2014-01-11 NOTE — H&P (View-Only) (Signed)
Patient ID: Jeanette Hill, female   DOB: 1955-01-18, 59 y.o.   MRN: [id number]   SUBJECTIVE:  Doing well.  NO complaints 2nd portion of myovue today   OBJECTIVE:   Vitals:   Filed Vitals:   01/09/14 0951 01/09/14 1403 01/09/14 2139 01/10/14 0715  BP: 131/96 100/81 137/82 143/71  Pulse:  72 77 77  Temp:   98.3 F (36.8 C) 98.3 F (36.8 C)  TempSrc:   Oral Oral  Resp:  18    Height:      Weight:    119.568 kg (263 lb 9.6 oz)  SpO2:  95% 96% 96%    TELEMETRY: Reviewed telemetry pt in NSR with PAC's and several short bursts of nonsustained atrial tachycardia on tele   PHYSICAL EXAM General: morbidly obese black female  Head: Eyes PERRLA, No xanthomas.   Normal cephalic and atramatic  Lungs:   Clear bilaterally to auscultation and percussion. Heart:   HRRR S1 S2 Pulses are 2+ & equal. Abdomen: Bowel sounds are positive, abdomen soft and non-tender without masses  Extremities:   No clubbing, cyanosis or edema.  DP +1 Neuro: Alert and oriented X 3. Psych:  Good affect, responds appropriately   LABS: Basic Metabolic Panel:  Recent Labs  40/98/11 0444  NA 139  K 4.1  CL 103  CO2 22  GLUCOSE 145*  BUN 9  CREATININE 0.85  CALCIUM 9.1    CBC:  Recent Labs  01/08/14 0444  WBC 6.7  HGB 10.8*  HCT 35.5*  MCV 80.7  PLT 209   Cardiac Enzymes:  Recent Labs  01/07/14 1031  TROPONINI <0.03   Coag Panel:   Lab Results  Component Value Date   INR 1.22 01/07/2014    RADIOLOGY: Dg Chest 2 View  01/07/2014   CLINICAL DATA:  Cough.  Chest pain.  Short of breath.  EXAM: CHEST  2 VIEW  COMPARISON:  None.  FINDINGS: Lateral view degraded by patient arm position. Moderate thoracic spondylosis. Numerous leads and wires project over the chest. Degenerate changes in both glenohumeral joints. Midline trachea. Mild cardiomegaly. Aortic atherosclerosis. Mediastinal contours otherwise within normal limits. No pleural effusion or pneumothorax. No congestive failure. Clear lungs.   IMPRESSION: Cardiomegaly without congestive failure.  Aortic atherosclerosis.   Electronically Signed   By: Jeronimo Greaves M.D.   On: 01/07/2014 00:51   Ct Angio Chest Pe W/cm &/or Wo Cm  01/07/2014   CLINICAL DATA:  Pulmonary embolism. Palpitations and positive D-dimer.  EXAM: CT ANGIOGRAPHY CHEST WITH CONTRAST  TECHNIQUE: Multidetector CT imaging of the chest was performed using the standard protocol during bolus administration of intravenous contrast. Multiplanar CT image reconstructions and MIPs were obtained to evaluate the vascular anatomy.  CONTRAST:  74.54mL OMNIPAQUE IOHEXOL 350 MG/ML SOLN  COMPARISON:  None.  FINDINGS: THORACIC INLET/BODY WALL:  No acute abnormality.  MEDIASTINUM:  Cardiomegaly. No pericardial effusion. There is no convincing pulmonary embolism. Note that respiratory motion and soft tissue attenuation are limiting factors, with motion especially prominent at the right apex and at both bases. This particularly limits evaluation of subsegmental vessels. No acute aortic pathology. No adenopathy.  LUNG WINDOWS:  No consolidation.  No effusion.  No suspicious pulmonary nodule.  UPPER ABDOMEN:  No acute findings.  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  Review of the MIP images confirms the above findings.  IMPRESSION: 1. Negative for pulmonary embolism. Note that respiratory motion limits assessment at multiple levels, mainly of the subsegmental arterial  tree. 2. Cardiomegaly without failure.   Electronically Signed   By: Tiburcio Pea M.D.   On: 01/07/2014 02:28    Assessment/Plan: Principal Problem:  Palpitation with SVT and nonsustained atrial tachycardia Active Problems:  Anemia  Dizziness  Essential hypertension  Diabetes mellitus without complication  HLD (hyperlipidemia)  OSA (obstructive sleep apnea)  GERD (gastroesophageal reflux disease)  Hypothyroidism  Chest pain - atypical and sharp.  Cardiac enzymes  negative x 3.  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   2 day Lexiscan myoview ordered by Dr Mayford Knife  due to reduced LVF on echo.  She had CP with her SVT but it was atypical and very sharp.  She also has been having DOE which could be an anginal equivalent but also could be due to underlying morbid obesity.   2.  Changed Cardizem to CD  daily and increase as needed for breakthrough atrial tachycardia  Likely d/c late today  if myovue low risk    Charlton Haws, MD  01/10/2014  9:08 AM

## 2014-01-11 NOTE — Progress Notes (Signed)
Subjective: No CP  No SOB  Objective: Filed Vitals:   01/10/14 1450 01/10/14 2100 01/11/14 0548 01/11/14 0737  BP: 145/90 128/63 136/70   Pulse: 83 91 81 90  Temp: 97.8 F (36.6 C) 97.9 F (36.6 C) 98.3 F (36.8 C)   TempSrc: Oral  Oral   Resp: 18 78 18   Height:      Weight:   264 lb 8 oz (119.976 kg)   SpO2: 97% 95% 95%    Weight change:   Intake/Output Summary (Last 24 hours) at 01/11/14 0848 Last data filed at 01/10/14 2100  Gross per 24 hour  Intake    240 ml  Output      0 ml  Net    240 ml    General: Alert, awake, oriented x3, in no acute distress Neck:  JVP is normal Heart: Regular rate and rhythm, without murmurs, rubs, gallops.  Lungs: Clear to auscultation.  No rales or wheezes. Exemities:  No edema.   Neuro: Grossly intact, nonfocal.   Lab Results: Results for orders placed or performed during the hospital encounter of 01/06/14 (from the past 24 hour(s))  Glucose, capillary     Status: Abnormal   Collection Time: 01/10/14 11:49 AM  Result Value Ref Range   Glucose-Capillary 156 (H) 70 - 99 mg/dL  Glucose, capillary     Status: Abnormal   Collection Time: 01/10/14  5:01 PM  Result Value Ref Range   Glucose-Capillary 101 (H) 70 - 99 mg/dL  Glucose, capillary     Status: Abnormal   Collection Time: 01/10/14  9:30 PM  Result Value Ref Range   Glucose-Capillary 117 (H) 70 - 99 mg/dL    Studies/Results: Nm Myocar Multi W/spect W/wall Motion / Ef  01/10/2014   CLINICAL DATA:  Chest pain and dyspnea on exertion.  EXAM: MYOCARDIAL IMAGING WITH SPECT (REST AND PHARMACOLOGIC-STRESS - 2 DAY PROTOCOL)  GATED LEFT VENTRICULAR WALL MOTION STUDY  LEFT VENTRICULAR EJECTION FRACTION  TECHNIQUE: Standard myocardial SPECT imaging was performed after resting intravenous injection of 30 mCi Tc-28m sestamibi. Subsequently, on a second day, intravenous infusion of Lexiscan was performed under the supervision of the Cardiology staff. At peak effect of the drug, 30 mCi  Tc-69m sestamibi was injected intravenously and standard myocardial SPECT imaging was performed. Quantitative gated imaging was also performed to evaluate left ventricular wall motion, and estimate left ventricular ejection fraction.  COMPARISON:  Chest CT 01/07/2014  FINDINGS: Perfusion: There is decreased activity in the low anterior wall on the stress images. This appears normal on the rest images and highly suspicious for ischemia. No fixed defects to suggest scar/infarction.  Wall Motion: Global hypokinesis and anterior wall motion abnormality.  Left Ventricular Ejection Fraction: 29 %  End diastolic volume 100 into ml  End systolic volume 73 ml  IMPRESSION: 1. Moderate sized area of Low anterior wall ischemia.  2. Global hypokinesis with anterior wall motion abnormality.  3. Left ventricular ejection fraction 29%  4. High-risk stress test findings*.  These results will be called to the ordering clinician or representative by the Radiologist Assistant, and communication documented in the PACS or zVision Dashboard.  *2012 Appropriate Use Criteria for Coronary Revascularization Focused Update: J Am Coll Cardiol. 2012;59(9):857-881. http://content.[email or web address].aspx?articleid=1201161   Electronically Signed   By: Loralie Champagne M.D.   On: 01/10/2014 12:55    Medications:Reviewed   @  1. CP  Atypical  Cath with no CAD  LVEDP 10    2.  Mild LV dysfunction by echo  45 to 50%   Volume  OK   3.  SVT  Treated at outside hosp  Converted with cardiazem. I would switch to lopressor  Follow for recurrence     4  HTN  Continue medical Rx.    5.  Aortic atherosclerosis.  Needs to stay on statin.    I have set up appt for patient with Thursday Jan 28 with T Turner.  At 9:30 AM ([address] [employer])  Will be available as needed .    LOS: 5 days   Dietrich Pates 01/11/2014, 8:48 AM

## 2014-01-11 NOTE — CV Procedure (Signed)
       PROCEDURE:  Left heart catheterization with selective coronary angiography, left ventriculogram.  INDICATIONS:  Abnormal stress test  The risks, benefits, and details of the procedure were explained to the patient.  The patient verbalized understanding and wanted to proceed.  Informed written consent was obtained.  PROCEDURE TECHNIQUE:  After Xylocaine anesthesia a 46F slender sheath was placed in the right radial artery with a single anterior needle wall stick.   IV Heparin was given.  Right coronary angiography was done using a Judkins R4 guide catheter.  Left coronary angiography was done using a Judkins L3.5 guide catheter.  Left ventriculography was done using a pigtail catheter.  A TR band was used for hemostasis.   CONTRAST:  Total of 75 cc.  COMPLICATIONS:  None.    HEMODYNAMICS:  Aortic pressure was 139/73; LV pressure was 141/3; LVEDP 10.  There was no gradient between the left ventricle and aorta.    ANGIOGRAPHIC DATA:   The left main coronary artery is a large vessel which is widely patent.  The left anterior descending artery is a large vessel which wraps around the apex. There is a large first diagonal which is patent. The LAD system is without any significant disease.  The left circumflex artery is a large vessel which branches across lateral wall. There is a large first obtuse marginal which appears angiographically normal. The remainder of the circumflex is medium-sized and widely patent.  The right coronary artery is a large vessel which has only minimal plaquing. The posterior descending artery is tortuous but widely patent. The posterior lateral artery is large and widely patent.  LEFT VENTRICULOGRAM:  Left ventricular angiogram was done in the 30 RAO projection and revealed mildly decreased left ventricular systolic function with an estimated ejection fraction of 40-45 %.  LVEDP was 10 mmHg.  it is difficult to judge left ventricular ejection fraction due to  frequent PACs.  IMPRESSIONS:  1. Widely patent left main coronary artery. 2. Widely patent left anterior descending artery and its branches. 3. Widely patent left circumflex artery and its branches. 4. Widely patent right coronary artery. 5. Mildly decreased left ventricular systolic function.  LVEDP 10 mmHg.  Ejection fraction 40-45%.  RECOMMENDATION:  Continue medical therapy. Consider changing her calcium channel blocker to a beta blocker given the concern for decreased left ventricular ejection fraction.  Cardiology follow-up with Dr. Mayford Knife.

## 2014-01-11 NOTE — Discharge Summary (Signed)
Physician Discharge Summary  Jeanette Hill ZOX:[id number] DOB: 01/11/1955 DOA: 01/06/2014  PCP: Alain Honey, PA-C  Admit date: 01/06/2014 Discharge date: 01/11/2014  Time spent: greater than 30 minutes  Recommendations for Outpatient Follow-up:   Discharge Diagnoses:  Principal Problem:   Palpitation Active Problems:   Anemia   Dizziness   Essential hypertension   Diabetes mellitus without complication   HLD (hyperlipidemia)   OSA (obstructive sleep apnea)   GERD (gastroesophageal reflux disease)   Hypothyroidism   Chest pain   Cough   Hypokalemia   SVT (supraventricular tachycardia)   DOE (dyspnea on exertion)   Abnormal nuclear stress test   Discharge Condition: stable  Filed Weights   01/09/14 0614 01/10/14 0715 01/11/14 0548  Weight: 120.884 kg (266 lb 8 oz) 119.568 kg (263 lb 9.6 oz) 119.976 kg (264 lb 8 oz)    History of present illness:  59 y.o. female with past medical history of diabetes mellitus, hypertension, hyperlipidemia, OSA, GERD, hypothyroidism, who presented with palpitations and dizziness. Patient is transferred from [facility] to [place].  Patient reports that at about 10:30 aM, she started having palpitation, and feeling that her heart was beating rapidly and abnormally. It was associated with dizziness and sweating. She does not have unilateral weakness or numbness in her extremities. No hearing, or vision change. Patient had 2 episode of mild chest pain while she was having palpitation, each time lasted for about 10-15 minutes. She was evaluated in the ED of [facility]. She was found to have SVT on EKG there. She was treated with the 25 mg of Cardizem, and SVT was converted to sinus rhythm before transfer to [facility] hospital. When I evaluated pt on the floor, she does not have chest pain or palpitation. Repeated EKG showed sinus rhythm.  Patient also reports having mild cough during last week. She coughs up a little whitish sputum. She  does not have runny nose or sore throat. No fever, but has chills. No recent history of long distance traveling. No tenderness over the calf areas.  Patient denies fever, headaches, abdominal pain, diarrhea, constipation, dysuria, urgency, frequency, hematuria, skin rashes, leg swelling.  Work up in the ED demonstrates SVT by EKG. no leukocytosis. Portable chest x-ray had a limited study due to obscured soft tissues. No leukocytosis. Patient is admitted to inpatient for further evaluation and treatment.  Hospital Course:   -SVT converted to sinus rhythm after Cardizem.  - troponin x 3 neg - CXR-2 view: cardiomegaly - 2 d echo- EF 45 % - D-dimer + but CTA negative -high risk stress test. Cardiac cath with clean coronaries. EF 40-45%. cardizem changed to metoprolol, given low EF. Need outpatient OSA eval  Hypothyroidism: Patient is on Synthroid 50 mcg at home  - continue Synthroid -tsh ok  Hypokalemia corrected   Consultants: 1. cardiology  Procedures:  Cardiac catheterization 2. Widely patent left main coronary artery. 3. Widely patent left anterior descending artery and its branches. 4. Widely patent left circumflex artery and its branches. 5. Widely patent right coronary artery. 6. Mildly decreased left ventricular systolic function. LVEDP 10 mmHg. Ejection fraction 40-45%.  Discharge Exam: Filed Vitals:   01/11/14 1312  BP: 171/86  Pulse: 75  Temp:   Resp: 17    General: comfortable Cardiovascular: RRR Respiratory: CTA  Discharge Instructions   Discharge Instructions    Activity as tolerated - No restrictions    Complete by:  As directed      Diet - low sodium heart  healthy    Complete by:  As directed      Diet Carb Modified    Complete by:  As directed           Current Discharge Medication List    START taking these medications   Details  metoprolol tartrate (LOPRESSOR) 50 MG tablet Take 1 tablet (50 mg total) by mouth 2 (two) times  daily. Qty: 60 tablet, Refills: 1      CONTINUE these medications which have NOT CHANGED   Details  aspirin EC 325 MG tablet Take 325 mg by mouth daily.    atorvastatin (LIPITOR) 40 MG tablet Take 40 mg by mouth daily at 6 PM.    cetirizine (ZYRTEC) 10 MG tablet Take 10 mg by mouth at bedtime.    gabapentin (NEURONTIN) 300 MG capsule Take 300 mg by mouth at bedtime.    glipiZIDE (GLUCOTROL) 10 MG tablet Take 5 mg by mouth daily before breakfast.    insulin glargine (LANTUS) 100 UNIT/ML injection Inject 25 Units into the skin every morning.    levothyroxine (SYNTHROID, LEVOTHROID) 50 MCG tablet Take 50 mcg by mouth daily before breakfast.    metFORMIN (GLUCOPHAGE-XR) 500 MG 24 hr tablet Take 500 mg by mouth 2 (two) times daily.    olmesartan-hydrochlorothiazide (BENICAR HCT) 20-12.5 MG per tablet Take 1 tablet by mouth daily.    omeprazole (PRILOSEC) 20 MG capsule Take 20 mg by mouth daily.    traMADol (ULTRAM) 50 MG tablet Take 50 mg by mouth every 4 (four) hours as needed for moderate pain (leg pain).        Not on File    The results of significant diagnostics from this hospitalization (including imaging, microbiology, ancillary and laboratory) are listed below for reference.    Significant Diagnostic Studies: Dg Chest 2 View  01/07/2014   CLINICAL DATA:  Cough.  Chest pain.  Short of breath.  EXAM: CHEST  2 VIEW  COMPARISON:  None.  FINDINGS: Lateral view degraded by patient arm position. Moderate thoracic spondylosis. Numerous leads and wires project over the chest. Degenerate changes in both glenohumeral joints. Midline trachea. Mild cardiomegaly. Aortic atherosclerosis. Mediastinal contours otherwise within normal limits. No pleural effusion or pneumothorax. No congestive failure. Clear lungs.  IMPRESSION: Cardiomegaly without congestive failure.  Aortic atherosclerosis.   Electronically Signed   By: Jeronimo Greaves M.D.   On: 01/07/2014 00:51   Ct Angio Chest Pe W/cm &/or  Wo Cm  01/07/2014   CLINICAL DATA:  Pulmonary embolism. Palpitations and positive D-dimer.  EXAM: CT ANGIOGRAPHY CHEST WITH CONTRAST  TECHNIQUE: Multidetector CT imaging of the chest was performed using the standard protocol during bolus administration of intravenous contrast. Multiplanar CT image reconstructions and MIPs were obtained to evaluate the vascular anatomy.  CONTRAST:  74.81mL OMNIPAQUE IOHEXOL 350 MG/ML SOLN  COMPARISON:  None.  FINDINGS: THORACIC INLET/BODY WALL:  No acute abnormality.  MEDIASTINUM:  Cardiomegaly. No pericardial effusion. There is no convincing pulmonary embolism. Note that respiratory motion and soft tissue attenuation are limiting factors, with motion especially prominent at the right apex and at both bases. This particularly limits evaluation of subsegmental vessels. No acute aortic pathology. No adenopathy.  LUNG WINDOWS:  No consolidation.  No effusion.  No suspicious pulmonary nodule.  UPPER ABDOMEN:  No acute findings.  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  Review of the MIP images confirms the above findings.  IMPRESSION: 1. Negative for pulmonary embolism. Note that respiratory motion limits assessment  at multiple levels, mainly of the subsegmental arterial tree. 2. Cardiomegaly without failure.   Electronically Signed   By: Tiburcio Pea M.D.   On: 01/07/2014 02:28   Nm Myocar Multi W/spect W/wall Motion / Ef  01/10/2014   CLINICAL DATA:  Chest pain and dyspnea on exertion.  EXAM: MYOCARDIAL IMAGING WITH SPECT (REST AND PHARMACOLOGIC-STRESS - 2 DAY PROTOCOL)  GATED LEFT VENTRICULAR WALL MOTION STUDY  LEFT VENTRICULAR EJECTION FRACTION  TECHNIQUE: Standard myocardial SPECT imaging was performed after resting intravenous injection of 30 mCi Tc-76m sestamibi. Subsequently, on a second day, intravenous infusion of Lexiscan was performed under the supervision of the Cardiology staff. At peak effect of the drug, 30 mCi  Tc-82m sestamibi was injected intravenously and standard myocardial SPECT imaging was performed. Quantitative gated imaging was also performed to evaluate left ventricular wall motion, and estimate left ventricular ejection fraction.  COMPARISON:  Chest CT 01/07/2014  FINDINGS: Perfusion: There is decreased activity in the low anterior wall on the stress images. This appears normal on the rest images and highly suspicious for ischemia. No fixed defects to suggest scar/infarction.  Wall Motion: Global hypokinesis and anterior wall motion abnormality.  Left Ventricular Ejection Fraction: 29 %  End diastolic volume 100 into ml  End systolic volume 73 ml  IMPRESSION: 1. Moderate sized area of Low anterior wall ischemia.  2. Global hypokinesis with anterior wall motion abnormality.  3. Left ventricular ejection fraction 29%  4. High-risk stress test findings*.  These results will be called to the ordering clinician or representative by the Radiologist Assistant, and communication documented in the PACS or zVision Dashboard.  *2012 Appropriate Use Criteria for Coronary Revascularization Focused Update: J Am Coll Cardiol. 2012;59(9):857-881. http://content.dementiazones.com.aspx?articleid=[email or web address]   Electronically Signed   By: Loralie Champagne M.D.   On: 01/10/2014 12:55   EKG Normal sinus rhythm with sinus arrhythmia Left anterior fascicular block Septal infarct , age undetermined  Echo Left ventricle: The cavity size was normal. Systolic function was mildly reduced. The estimated ejection fraction was in the range of 45% to 50%. - Aortic valve: Mildly calcified leaflets. - Mitral valve: Calcified annulus. There was trivial regurgitation. - Right ventricle: The cavity size was mildly dilated. Wall thickness was normal. - Atrial septum: No defect or patent foramen ovale was identified.  Microbiology: Recent Results (from the past 240 hour(s))  Culture, sputum-assessment     Status: None    Collection Time: 01/07/14  1:05 AM  Result Value Ref Range Status   Specimen Description SPUTUM  Final   Special Requests NONE  Final   Sputum evaluation   Final    THIS SPECIMEN IS ACCEPTABLE. RESPIRATORY CULTURE REPORT TO FOLLOW.   Report Status 01/07/2014 FINAL  Final  Culture, respiratory (NON-Expectorated)     Status: None   Collection Time: 01/07/14  1:05 AM  Result Value Ref Range Status   Specimen Description SPUTUM  Final   Special Requests NONE  Final   Gram Stain   Final    FEW WBC PRESENT,BOTH PMN AND MONONUCLEAR RARE SQUAMOUS EPITHELIAL CELLS PRESENT FEW GRAM POSITIVE COCCI IN PAIRS Performed at [facility]    Culture   Final    NORMAL OROPHARYNGEAL FLORA Performed at [facility]    Report Status 01/10/2014 FINAL  Final     Labs: Basic Metabolic Panel:  Recent Labs Lab 01/07/14 0440 01/08/14 0444  NA 139 139  K 3.4* 4.1  CL 102 103  CO2 29 22  GLUCOSE 120*  145*  BUN 10 9  CREATININE 0.87 0.85  CALCIUM 9.3 9.1   Liver Function Tests:  Recent Labs Lab 01/07/14 0440  AST 19  ALT 16  ALKPHOS 92  BILITOT 0.3  PROT 7.2  ALBUMIN 3.4*   No results for input(s): LIPASE, AMYLASE in the last 168 hours. No results for input(s): AMMONIA in the last 168 hours. CBC:  Recent Labs Lab 01/07/14 0440 01/08/14 0444  WBC 7.4 6.7  HGB 10.7* 10.8*  HCT 35.1* 35.5*  MCV 78.3 80.7  PLT 223 209   Cardiac Enzymes:  Recent Labs Lab 01/06/14 2348 01/07/14 0440 01/07/14 1031  TROPONINI <0.03 <0.03 <0.03   BNP: BNP (last 3 results) No results for input(s): PROBNP in the last 8760 hours. CBG:  Recent Labs Lab 01/10/14 1149 01/10/14 1701 01/10/14 2130 01/11/14 0842 01/11/14 1143  GLUCAP 156* 101* 117* 106* 138*       Signed:  Rama Mcclintock L  [facility] 01/11/2014, 1:40 PM

## 2014-01-11 NOTE — Discharge Instructions (Signed)
Diabetes Mellitus and Food It is important for you to manage your blood sugar (glucose) level. Your blood glucose level can be greatly affected by what you eat. Eating healthier foods in the appropriate amounts throughout the day at about the same time each day will help you control your blood glucose level. It can also help slow or prevent worsening of your diabetes mellitus. Healthy eating may even help you improve the level of your blood pressure and reach or maintain a healthy weight.  HOW CAN FOOD AFFECT ME? Carbohydrates Carbohydrates affect your blood glucose level more than any other type of food. Your dietitian will help you determine how many carbohydrates to eat at each meal and teach you how to count carbohydrates. Counting carbohydrates is important to keep your blood glucose at a healthy level, especially if you are using insulin or taking certain medicines for diabetes mellitus. Alcohol Alcohol can cause sudden decreases in blood glucose (hypoglycemia), especially if you use insulin or take certain medicines for diabetes mellitus. Hypoglycemia can be a life-threatening condition. Symptoms of hypoglycemia (sleepiness, dizziness, and disorientation) are similar to symptoms of having too much alcohol.  If your health care provider has given you approval to drink alcohol, do so in moderation and use the following guidelines:  Women should not have more than one drink per day, and men should not have more than two drinks per day. One drink is equal to:  12 oz of beer.  5 oz of wine.  1 oz of hard liquor.  Do not drink on an empty stomach.  Keep yourself hydrated. Have water, diet soda, or unsweetened iced tea.  Regular soda, juice, and other mixers might contain a lot of carbohydrates and should be counted. WHAT FOODS ARE NOT RECOMMENDED? As you make food choices, it is important to remember that all foods are not the same. Some foods have fewer nutrients per serving than other  foods, even though they might have the same number of calories or carbohydrates. It is difficult to get your body what it needs when you eat foods with fewer nutrients. Examples of foods that you should avoid that are high in calories and carbohydrates but low in nutrients include:  Trans fats (most processed foods list trans fats on the Nutrition Facts label).  Regular soda.  Juice.  Candy.  Sweets, such as cake, pie, doughnuts, and cookies.  Fried foods. WHAT FOODS CAN I EAT? Have nutrient-rich foods, which will nourish your body and keep you healthy. The food you should eat also will depend on several factors, including:  The calories you need.  The medicines you take.  Your weight.  Your blood glucose level.  Your blood pressure level.  Your cholesterol level. You also should eat a variety of foods, including:  Protein, such as meat, poultry, fish, tofu, nuts, and seeds (lean animal proteins are best).  Fruits.  Vegetables.  Dairy products, such as milk, cheese, and yogurt (low fat is best).  Breads, grains, pasta, cereal, rice, and beans.  Fats such as olive oil, trans fat-free margarine, canola oil, avocado, and olives. DOES EVERYONE WITH DIABETES MELLITUS HAVE THE SAME MEAL PLAN? Because every person with diabetes mellitus is different, there is not one meal plan that works for everyone. It is very important that you meet with a dietitian who will help you create a meal plan that is just right for you. Document Released: 09/21/2004 Document Revised: 12/30/2012 Document Reviewed: 11/21/2012 [facility] Patient Information 2015 [place], [place]. This  information is not intended to replace advice given to you by your health care provider. Make sure you discuss any questions you have with your health care provider.  Supraventricular Tachycardia Supraventricular tachycardia (SVT) is when the heart beats very fast. SVT can last for a long time (sustained) or it can start  and stop suddenly (nonsustained). HOME CARE   Take your heart medicine as told by your doctor. Check with your doctor before taking cold, diet, or herbal medicine.  Do not smoke.  Do not drink large amounts of caffeine.Caffeine is found in coffee, tea, soda (pop, cola), and chocolate.  Keep all doctor visits as told. GET HELP RIGHT AWAY IF:   You have chest pain or pressure.  You cannot catch your breath.  You are dizzy or lightheaded.  You feel like you will pass out (faint).  You are sweaty (diaphoretic) and feel sick to your stomach (nauseous) or throw up (vomit).  If you have the above problems, call your local emergency services (911 in [place] right away. Do not drive yourself to the hospital. MAKE SURE YOU:   Understand these instructions.  Will watch your condition.  Will get help right away if you are not doing well or get worse. Document Released: 12/25/2004 Document Revised: 03/19/2011 Document Reviewed: 03/31/2008 [facility] Patient Information 2015 [place], [place]. This information is not intended to replace advice given to you by your health care provider. Make sure you discuss any questions you have with your health care provider.  Chest Pain (Nonspecific) It is often hard to give a diagnosis for the cause of chest pain. There is always a chance that your pain could be related to something serious, such as a heart attack or a blood clot in the lungs. You need to follow up with your doctor. HOME CARE  If antibiotic medicine was given, take it as directed by your doctor. Finish the medicine even if you start to feel better.  For the next few days, avoid activities that bring on chest pain. Continue physical activities as told by your doctor.  Do not use any tobacco products. This includes cigarettes, chewing tobacco, and e-cigarettes.  Avoid drinking alcohol.  Only take medicine as told by your doctor.  Follow your doctor's suggestions for more testing if  your chest pain does not go away.  Keep all doctor visits you made. GET HELP IF:  Your chest pain does not go away, even after treatment.  You have a rash with blisters on your chest.  You have a fever. GET HELP RIGHT AWAY IF:   You have more pain or pain that spreads to your arm, neck, jaw, back, or belly (abdomen).  You have shortness of breath.  You cough more than usual or cough up blood.  You have very bad back or belly pain.  You feel sick to your stomach (nauseous) or throw up (vomit).  You have very bad weakness.  You pass out (faint).  You have chills. This is an emergency. Do not wait to see if the problems will go away. Call your local emergency services (911 in [place] Do not drive yourself to the hospital. MAKE SURE YOU:   Understand these instructions.  Will watch your condition.  Will get help right away if you are not doing well or get worse. Document Released: 06/13/2007 Document Revised: 12/30/2012 Document Reviewed: 06/13/2007 [facility] Patient Information 2015 [place], [place]. This information is not intended to replace advice given to you by your health care provider.  Make sure you discuss any questions you have with your health care provider.  Radial Site Care Refer to this sheet in the next few weeks. These instructions provide you with information on caring for yourself after your procedure. Your caregiver may also give you more specific instructions. Your treatment has been planned according to current medical practices, but problems sometimes occur. Call your caregiver if you have any problems or questions after your procedure. HOME CARE INSTRUCTIONS  You may shower the day after the procedure.Remove the bandage (dressing) and gently wash the site with plain soap and water.Gently pat the site dry.  Do not apply powder or lotion to the site.  Do not submerge the affected site in water for 3 to 5 days.  Inspect the site at least twice  daily.  Do not flex or bend the affected arm for 24 hours.  No lifting over 5 pounds (2.3 kg) for 5 days after your procedure.  Do not drive home if you are discharged the same day of the procedure. Have someone else drive you.  You may drive 24 hours after the procedure unless otherwise instructed by your caregiver.  Do not operate machinery or power tools for 24 hours.  A responsible adult should be with you for the first 24 hours after you arrive home. What to expect:  Any bruising will usually fade within 1 to 2 weeks.  Blood that collects in the tissue (hematoma) may be painful to the touch. It should usually decrease in size and tenderness within 1 to 2 weeks. SEEK IMMEDIATE MEDICAL CARE IF:  You have unusual pain at the radial site.  You have redness, warmth, swelling, or pain at the radial site.  You have drainage (other than a small amount of blood on the dressing).  You have chills.  You have a fever or persistent symptoms for more than 72 hours.  You have a fever and your symptoms suddenly get worse.  Your arm becomes pale, cool, tingly, or numb.  You have heavy bleeding from the site. Hold pressure on the site. Document Released: 01/27/2010 Document Revised: 03/19/2011 Document Reviewed: 01/27/2010 [facility] Patient Information 2015 [place], [place]. This information is not intended to replace advice given to you by your health care provider. Make sure you discuss any questions you have with your health care provider.

## 2014-01-11 NOTE — Progress Notes (Signed)
AUC  Ischemic Symptoms? CCS III (Marked limitation of ordinary activity) Anti-ischemic Medical Therapy? Minimal Therapy (1 class of medications) Non-invasive Test Results? High-risk stress test findings: cardiac mortality >3%/yr Prior CABG? No Previous CABG   Patient Information:   1-2V CAD, no prox LAD  A (8)  Indication: 18; Score: 8   Patient Information:   CTO of 1 vessel, no other CAD  A (7)  Indication: 28; Score: 7   Patient Information:   1V CAD with prox LAD  A (9)  Indication: 34; Score: 9   Patient Information:   2V-CAD with prox LAD  A (9)  Indication: 40; Score: 9   Patient Information:   3V-CAD without LMCA  A (9)  Indication: 46; Score: 9   Patient Information:   3V-CAD without LMCA With Abnormal LV systolic function  A (9)  Indication: 48; Score: 9   Patient Information:   LMCA-CAD  A (9)  Indication: 49; Score: 9   Patient Information:   2V-CAD with prox LAD PCI  A (7)  Indication: 62; Score: 7   Patient Information:   2V-CAD with prox LAD CABG  A (8)  Indication: 62; Score: 8   Patient Information:   3V-CAD without LMCA With Low CAD burden(i.e., 3 focal stenoses, low SYNTAX score) PCI  A (7)  Indication: 63; Score: 7   Patient Information:   3V-CAD without LMCA With Low CAD burden(i.e., 3 focal stenoses, low SYNTAX score) CABG  A (9)  Indication: 63; Score: 9   Patient Information:   3V-CAD without LMCA E06c - Intermediate-high CAD burden (i.e., multiple diffuse lesions, presence of CTO, or high SYNTAX score) PCI  U (4)  Indication: 64; Score: 4   Patient Information:   3V-CAD without LMCA E06c - Intermediate-high CAD burden (i.e., multiple diffuse lesions, presence of CTO, or high SYNTAX score) CABG  A (9)  Indication: 64; Score: 9   Patient Information:   LMCA-CAD With Isolated LMCA stenosis  PCI  U (6)  Indication: 65; Score: 6   Patient Information:   LMCA-CAD With Isolated  LMCA stenosis  CABG  A (9)  Indication: 65; Score: 9   Patient Information:   LMCA-CAD Additional CAD, low CAD burden (i.e., 1- to 2-vessel additional involvement, low SYNTAX score) PCI  U (5)  Indication: 66; Score: 5   Patient Information:   LMCA-CAD Additional CAD, low CAD burden (i.e., 1- to 2-vessel additional involvement, low SYNTAX score) CABG  A (9)  Indication: 66; Score: 9   Patient Information:   LMCA-CAD Additional CAD, intermediate-high CAD burden (i.e., 3-vessel involvement, presence of CTO, or high SYNTAX score) PCI  I (3)  Indication: 67; Score: 3   Patient Information:   LMCA-CAD Additional CAD, intermediate-high CAD burden (i.e., 3-vessel involvement, presence of CTO, or high SYNTAX score) CABG  A (9)  Indication: 67; Score: 9

## 2014-01-11 NOTE — Progress Notes (Signed)
UR Completed Guiliana Shor Graves-Bigelow, RN,BSN [phone]  

## 2014-01-11 NOTE — Interval H&P Note (Signed)
Cath Lab Visit (complete for each Cath Lab visit)  Clinical Evaluation Leading to the Procedure:   ACS: No.  Non-ACS:    Anginal Classification: CCS III  Anti-ischemic medical therapy: Minimal Therapy (1 class of medications)  Non-Invasive Test Results: High-risk stress test findings: cardiac mortality >3%/year  Prior CABG: No previous CABG      History and Physical Interval Note:  01/11/2014 7:40 AM  Jeanette Hill  has presented today for surgery, with the diagnosis of positive stress test  The various methods of treatment have been discussed with the patient and family. After consideration of risks, benefits and other options for treatment, the patient has consented to  Procedure(s): LEFT HEART CATHETERIZATION WITH CORONARY ANGIOGRAM (N/A) as a surgical intervention .  The patient's history has been reviewed, patient examined, no change in status, stable for surgery.  I have reviewed the patient's chart and labs.  Questions were answered to the patient's satisfaction.     Kabir Brannock S.

## 2014-01-12 NOTE — Telephone Encounter (Signed)
Pt st her doctor, Dr. Delford FieldWright, is going to set her up with a sleep study in [place] where she lives. She has an OV on Thursday. Instructed patient to call us if she does not order a sleep study.

## 2014-02-03 DIAGNOSIS — R079 Chest pain, unspecified: Secondary | ICD-10-CM | POA: Insufficient documentation

## 2014-02-03 NOTE — Progress Notes (Signed)
Cardiology Office Note   Date:  02/04/2014   ID:  Jeanette Hill, DOB 1955/06/02, MRN [id number]  PCP:  Alain HoneyWRIGHT, KRYSTAL R, PA-C  Cardiologist:   Quintella ReichertURNER,TRACI R, MD   Chief Complaint  Patient presents with  . Tachycardia  . Chest Pain  . Hypertension      History of Present Illness: Jeanette Hill is a 59 y.o.  morbidly obese female with a history of HTN, HLD, GERD, CAD, DMII, OSA, hypothyroidism. [facility] with SVT which converted to NSR with cardizem. Negative PE on CT angio. CXR: cardiomegaly, Aortic atherosclerosis. She had a CPAP at one point but it was taken away because she did not use it. She had an MI in 1999. No stents. Her mom died at 4065 with "heart trouble". Dad is 95 and has CAD.The patient presented on 01/07/2014 with dizziness, Rapid HR, diaphoresis and SOB. She also had 2 episodes of mild CP during the palpitations.  She was treated with cardizem and converted to NSR from SVT.  She ruled out for MI by serial troponins.  D-DImer was elevated but chest CT showed no PE.  She had a high risk nuclear stress test and underwent cardiac cath showing normal coronary arteries and mildly reduced LVF with EF 40-45% and therefore cardizem was changed to metoprolol.  She now presents back for followup.  She denies any chest pain, SOB, DOE, LE edema, dizziness, palpitations or syncope.    Past Medical History  Diagnosis Date  . Hypertension   . Hyperlipidemia   . GERD (gastroesophageal reflux disease)   . Hypothyroidism   . Myocardial infarction 11/1997    "slight one"  . Sleep apnea     "at one time; not now" (01/06/2014)  . Type II diabetes mellitus   . Arthritis     "legs, knees, arms, joints" (01/06/2014)  . Chronic lower back pain     Past Surgical History  Procedure Laterality Date  . Tubal ligation  1984  . Left heart catheterization with coronary angiogram N/A 01/11/2014    Procedure: LEFT HEART CATHETERIZATION WITH CORONARY  ANGIOGRAM;  Surgeon: Corky CraftsJayadeep S Varanasi, MD;  [facility];  Service: Cardiovascular;  Laterality: N/A;     Current Outpatient Prescriptions  Medication Sig Dispense Refill  . aspirin EC 325 MG tablet Take 325 mg by mouth daily.    Marland Kitchen. atorvastatin (LIPITOR) 40 MG tablet Take 40 mg by mouth daily at 6 PM.    . cetirizine (ZYRTEC) 10 MG tablet Take 10 mg by mouth at bedtime.    . gabapentin (NEURONTIN) 300 MG capsule Take 300 mg by mouth at bedtime.    Marland Kitchen. glipiZIDE (GLUCOTROL) 10 MG tablet Take 5 mg by mouth daily before breakfast.    . insulin glargine (LANTUS) 100 UNIT/ML injection Inject 25 Units into the skin every morning.    Marland Kitchen. levothyroxine (SYNTHROID, LEVOTHROID) 50 MCG tablet Take 50 mcg by mouth daily before breakfast.    . metFORMIN (GLUCOPHAGE-XR) 500 MG 24 hr tablet Take 500 mg by mouth 2 (two) times daily.    . metoprolol tartrate (LOPRESSOR) 50 MG tablet Take 1 tablet (50 mg total) by mouth 2 (two) times daily. 60 tablet 1  . olmesartan-hydrochlorothiazide (BENICAR HCT) 20-12.5 MG per tablet Take 1 tablet by mouth daily.    Marland Kitchen. omeprazole (PRILOSEC) 20 MG capsule Take 20 mg by mouth daily.    . traMADol (ULTRAM) 50 MG tablet Take 50 mg by mouth every 4 (  four) hours as needed for moderate pain (leg pain).      No current facility-administered medications for this visit.    Allergies:   Review of patient's allergies indicates not on file.    Social History:  The patient  reports that she has never smoked. She has never used smokeless tobacco. She reports that she does not drink alcohol or use illicit drugs.   Family History:  The patient's family history includes Diabetes in her brother, mother, and sister; Hypertension in her brother; Prostate cancer in her father.    ROS:  Please see the history of present illness.   Otherwise, review of systems are positive for none.   All other systems are reviewed and negative.    PHYSICAL EXAM: VS:  BP 132/72 mmHg  Pulse 78   Ht  (1.626 m)  Wt 261 lb (118.389 kg)  BMI 44.78 kg/m2  SpO2 96% , BMI Body mass index is 44.78 kg/(m^2). GEN: Well nourished, well developed, in no acute distress HEENT: normal Neck: no JVD, carotid bruits, or masses Cardiac: RRR; no murmurs, rubs, or gallops,no edema  Respiratory:  clear to auscultation bilaterally, normal work of breathing GI: soft, nontender, nondistended, + BS MS: no deformity or atrophy Skin: warm and dry, no rash Neuro:  Strength and sensation are intact Psych: euthymic mood, full affect   EKG:  EKG was ordered today and showed NSR at 71bpm with PAC's and low voltage QRS, septal infarct    Recent Labs: 01/06/2014: B Natriuretic Peptide 117.2* 01/07/2014: ALT 16; TSH 1.794 01/08/2014: BUN 9; Creatinine 0.85; Hemoglobin 10.8*; Platelets 209; Potassium 4.1; Sodium 139    Lipid Panel    Component Value Date/Time   CHOL 123 01/07/2014 0440   TRIG 60 01/07/2014 0440   HDL 31* 01/07/2014 0440   CHOLHDL 4.0 01/07/2014 0440   VLDL 12 01/07/2014 0440   LDLCALC 80 01/07/2014 0440      Wt Readings from Last 3 Encounters:  02/04/14 261 lb (118.389 kg)      Other studies Reviewed: Additional studies/ records that were reviewed today include: cardiac cath report, discharge summary, 2D echo. Review of the above records demonstrates: normal coronary arteries with mildly reduced LVF EF 40-45%   ASSESSMENT AND PLAN:  1.  Chest pain with normal coronary arteries by cath.  Most likely related to demand ischemia in the setting of SVT 2.  SVT with no further episodes of beta blockers 3.  HTN well controlled.  Continue BB/Benicar HCT 4.  Dyslipidemia - continue statin 5.  DM - per PCP 6.  Mild LV systolic dysfunction EF 45-50% possibly tachycardia induced.  Continue ARB and BB   Current medicines are reviewed at length with the patient today.  The patient does not have concerns regarding medicines.  The following changes have been made:  no  change  Labs/ tests ordered today include: None     Disposition:   FU with me in 6 months   Signed, Quintella Reichert, MD  02/04/2014 10:30 AM    [facility] [address] [place], [place], [place]  [address] Phone: [phone]; Fax: [phone]

## 2014-02-04 ENCOUNTER — Ambulatory Visit (INDEPENDENT_AMBULATORY_CARE_PROVIDER_SITE_OTHER): Payer: Medicare Other | Admitting: Cardiology

## 2014-02-04 ENCOUNTER — Encounter: Payer: Self-pay | Admitting: Cardiology

## 2014-02-04 VITALS — BP 132/72 | HR 78 | Ht 64.0 in | Wt 261.0 lb

## 2014-02-04 DIAGNOSIS — R0789 Other chest pain: Secondary | ICD-10-CM

## 2014-02-04 DIAGNOSIS — I10 Essential (primary) hypertension: Secondary | ICD-10-CM

## 2014-02-04 DIAGNOSIS — I471 Supraventricular tachycardia, unspecified: Secondary | ICD-10-CM

## 2014-02-04 DIAGNOSIS — E785 Hyperlipidemia, unspecified: Secondary | ICD-10-CM

## 2014-02-04 NOTE — Patient Instructions (Signed)
Your physician recommends that you continue on your current medications as directed. Please refer to the Current Medication list given to you today.  Your physician wants you to follow-up in: 6 months with Dr Turner You will receive a reminder letter in the mail two months in advance. If you don't receive a letter, please call our office to schedule the follow-up appointment.  

## 2014-05-03 ENCOUNTER — Encounter: Payer: Self-pay | Admitting: Cardiology

## 2015-12-02 IMAGING — CT CT ANGIO CHEST
2 of 8 series · 19 of 46 positions shown · IV contrast (Omni 300)
Comparison: None.

CLINICAL DATA: Pulmonary embolism. Palpitations and positive
D-dimer.

EXAM:
CT ANGIOGRAPHY CHEST WITH CONTRAST
TECHNIQUE: Multidetector CT imaging of the chest was performed using the
standard protocol during bolus administration of intravenous
contrast. Multiplanar CT image reconstructions and MIPs were
obtained to evaluate the vascular anatomy.
CONTRAST:  74.5mL OMNIPAQUE IOHEXOL 350 MG/ML SOLN

[Series 5: thins · axial · 0.57mm/px · z∈[-218,+30]mm · 16 of 274 slices shown]
[im 13/274  lung]
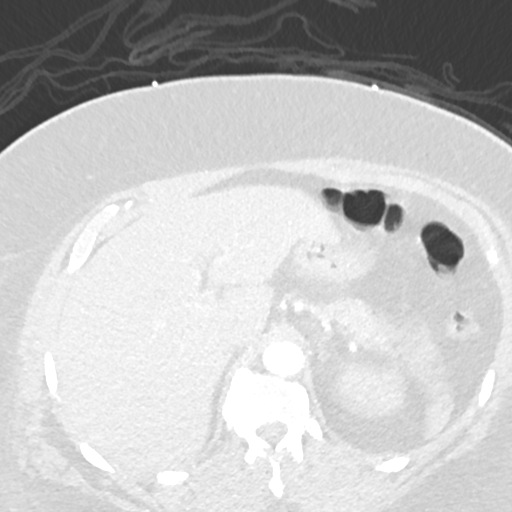
[im 25/274  soft-tissue]
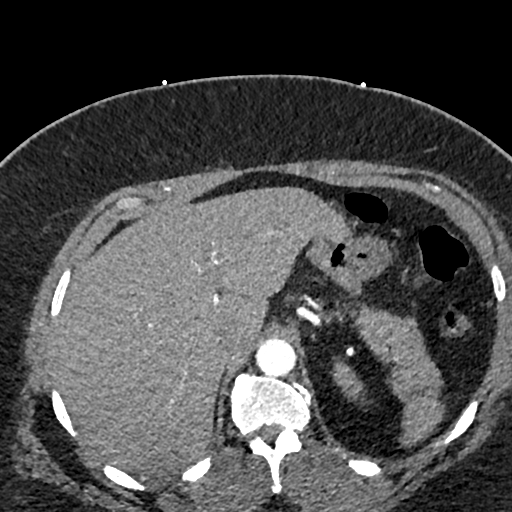
[im 50/274  lung]
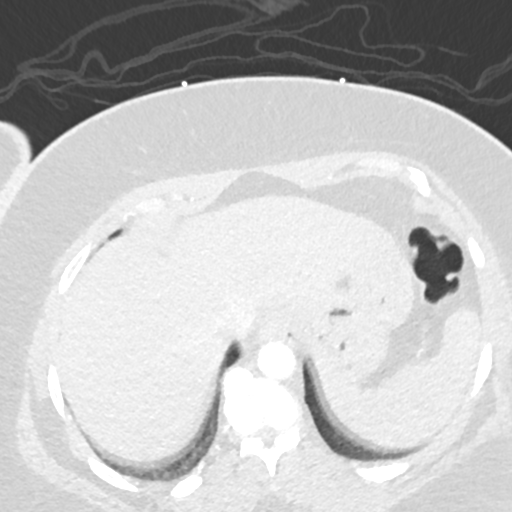
[im 63/274  soft-tissue]
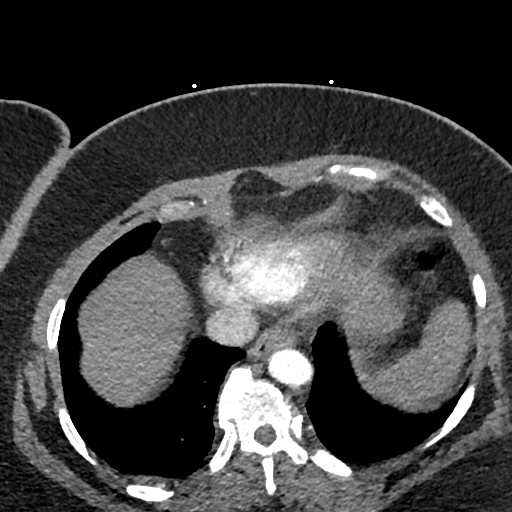
[im 75/274  lung]
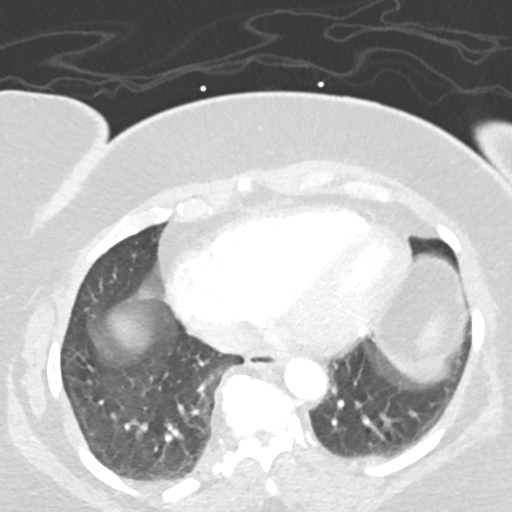
[im 100/274  soft-tissue]
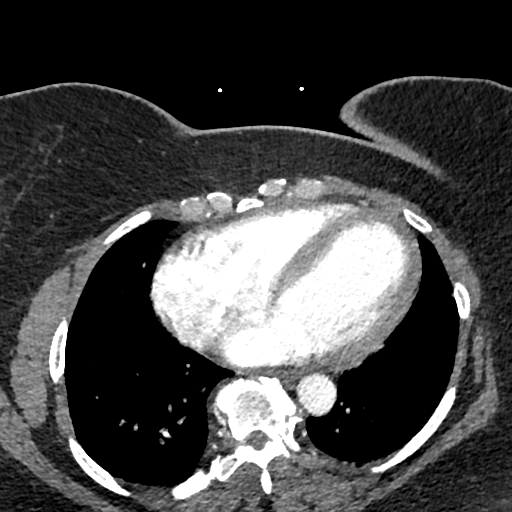
[im 112/274  lung]
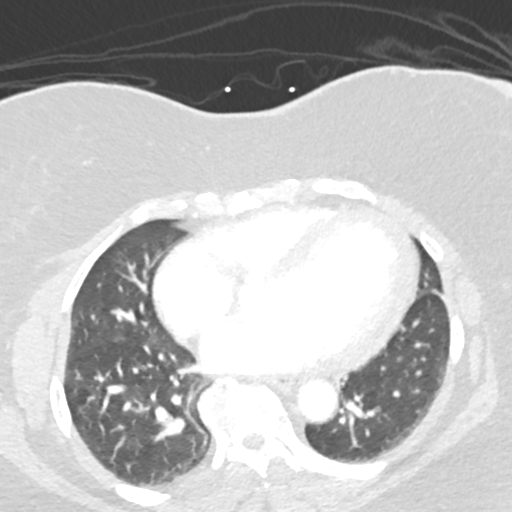
[im 125/274  soft-tissue]
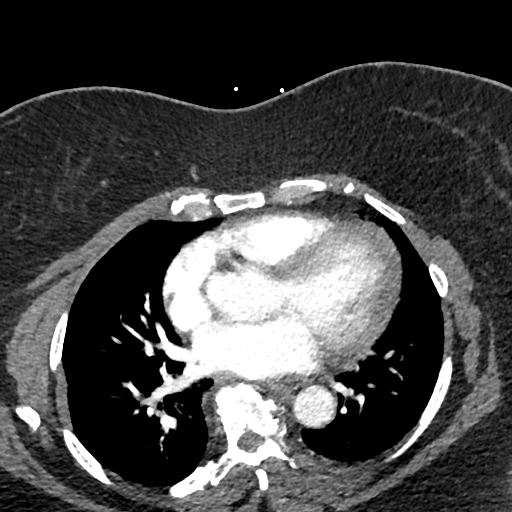
[im 149/274  lung]
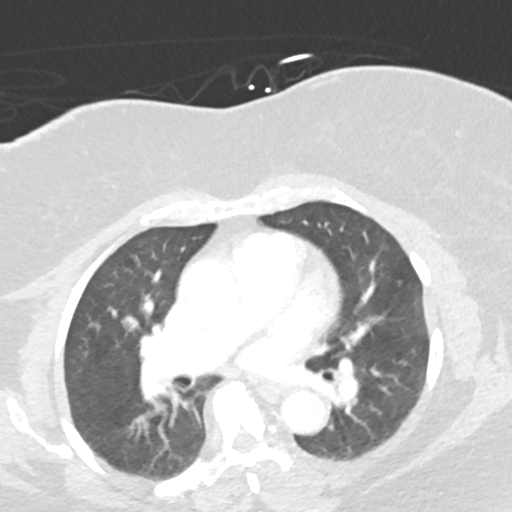
[im 162/274  soft-tissue]
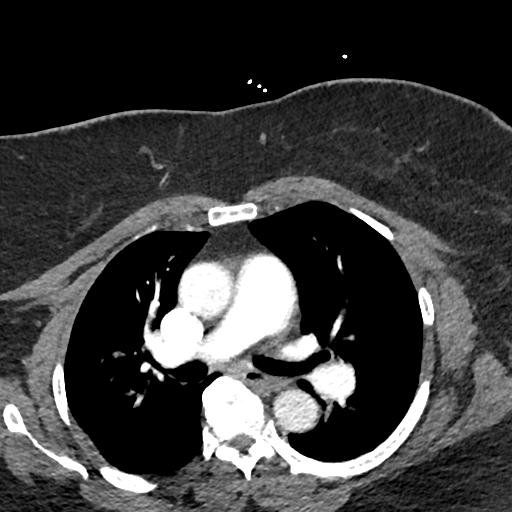
[im 174/274  lung]
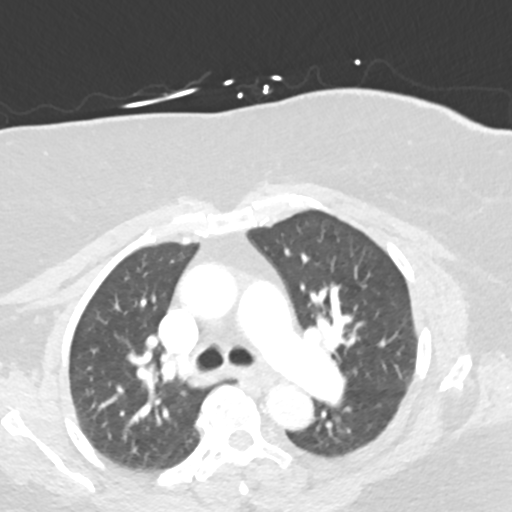
[im 199/274  soft-tissue]
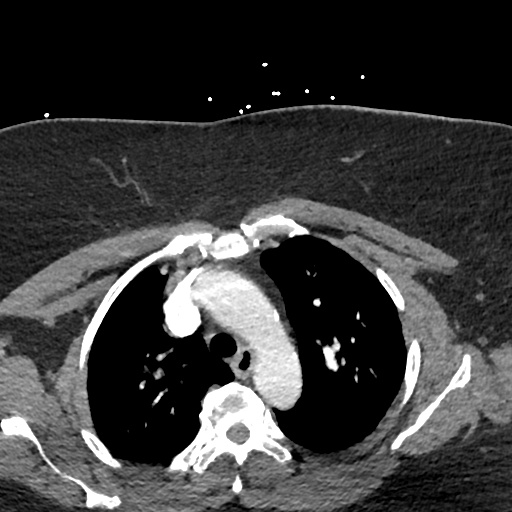
[im 211/274  lung]
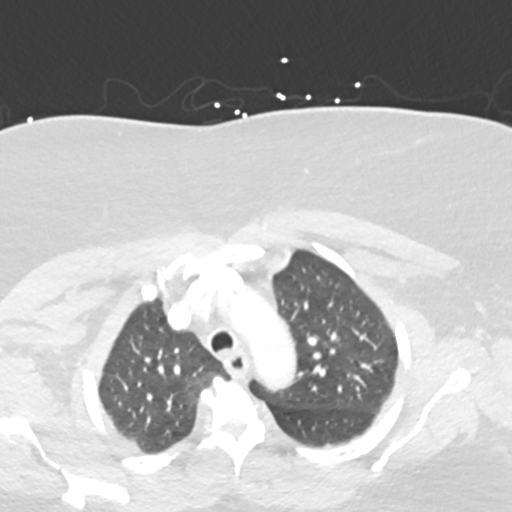
[im 224/274  soft-tissue]
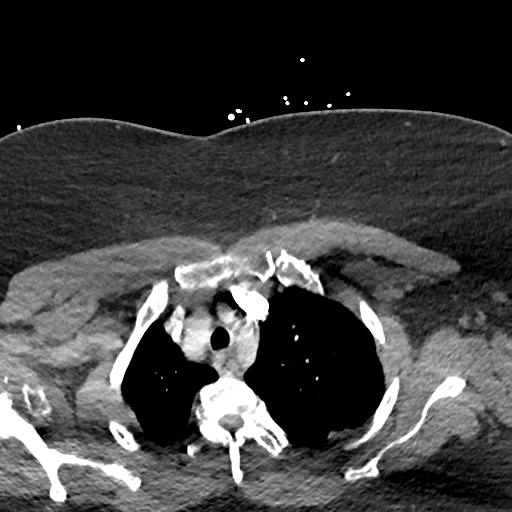
[im 249/274  lung]
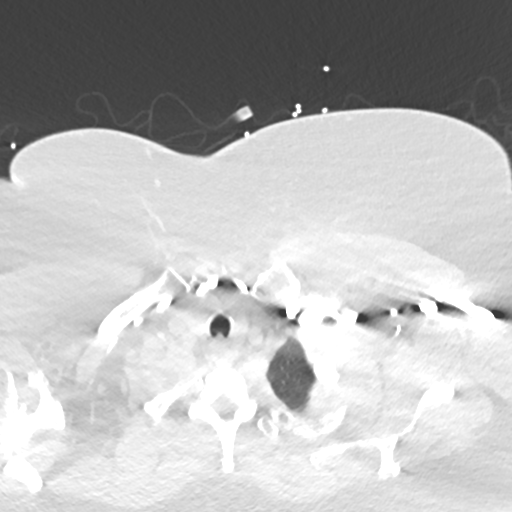
[im 261/274  soft-tissue]
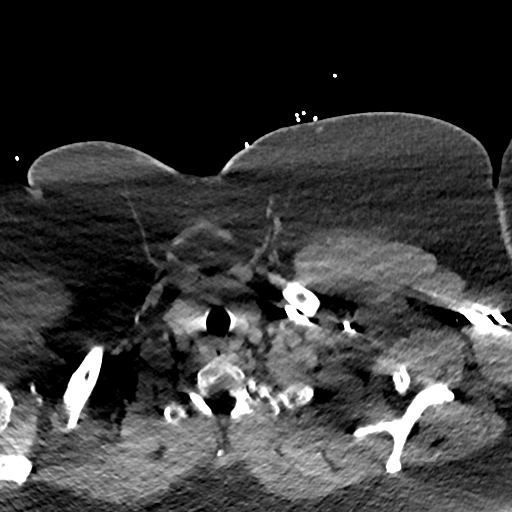

[Series 7: coronal mpr · coronal · 0.55mm/px · 3 of 103 slices shown]
[im 26/103  soft-tissue]
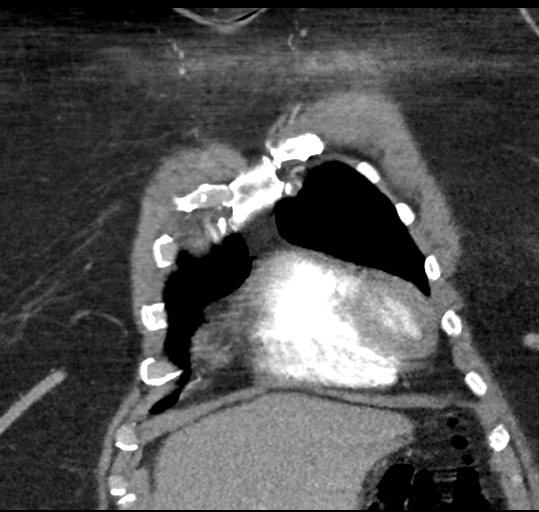
[im 52/103  soft-tissue]
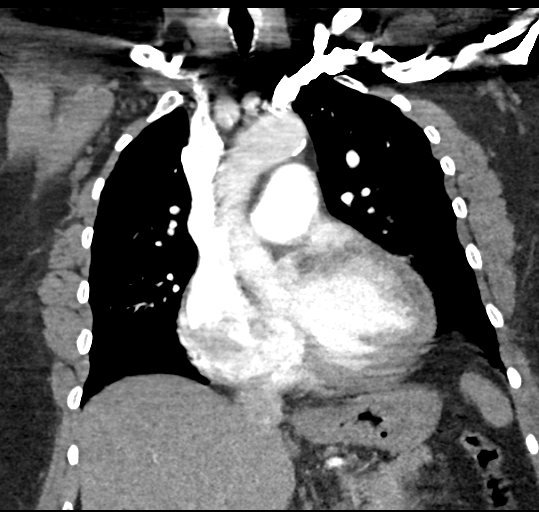
[im 77/103  soft-tissue]
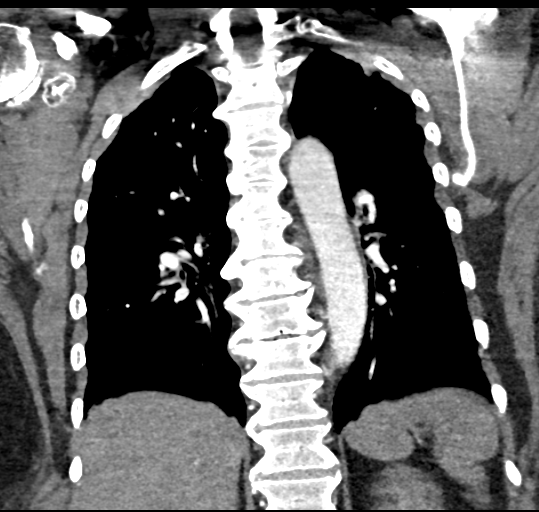

[19 of 46 positions shown; findings below may reference images not displayed]

FINDINGS: THORACIC INLET/BODY WALL:

No acute abnormality.

MEDIASTINUM:

Cardiomegaly. No pericardial effusion. There is no convincing
pulmonary embolism. Note that respiratory motion and soft tissue
attenuation are limiting factors, with motion especially prominent
at the right apex and at both bases. This particularly limits
evaluation of subsegmental vessels. No acute aortic pathology. No
adenopathy.

LUNG WINDOWS:

No consolidation.  No effusion.  No suspicious pulmonary nodule.

UPPER ABDOMEN:

No acute findings.

OSSEOUS:

Advanced bilateral glenohumeral osteoarthritis with secondary
osteochondromatosis.

Review of the MIP images confirms the above findings.
IMPRESSION: 1. Negative for pulmonary embolism. Note that respiratory motion
limits assessment at multiple levels, mainly of the subsegmental
arterial tree.
2. Cardiomegaly without failure.

## 2015-12-02 IMAGING — CR DG CHEST 2V
2 series · 2 of 2 positions shown · non-contrast
Comparison: None.

CLINICAL DATA: Cough.  Chest pain.  Short of breath.

EXAM:
CHEST  2 VIEW

[chest pa]
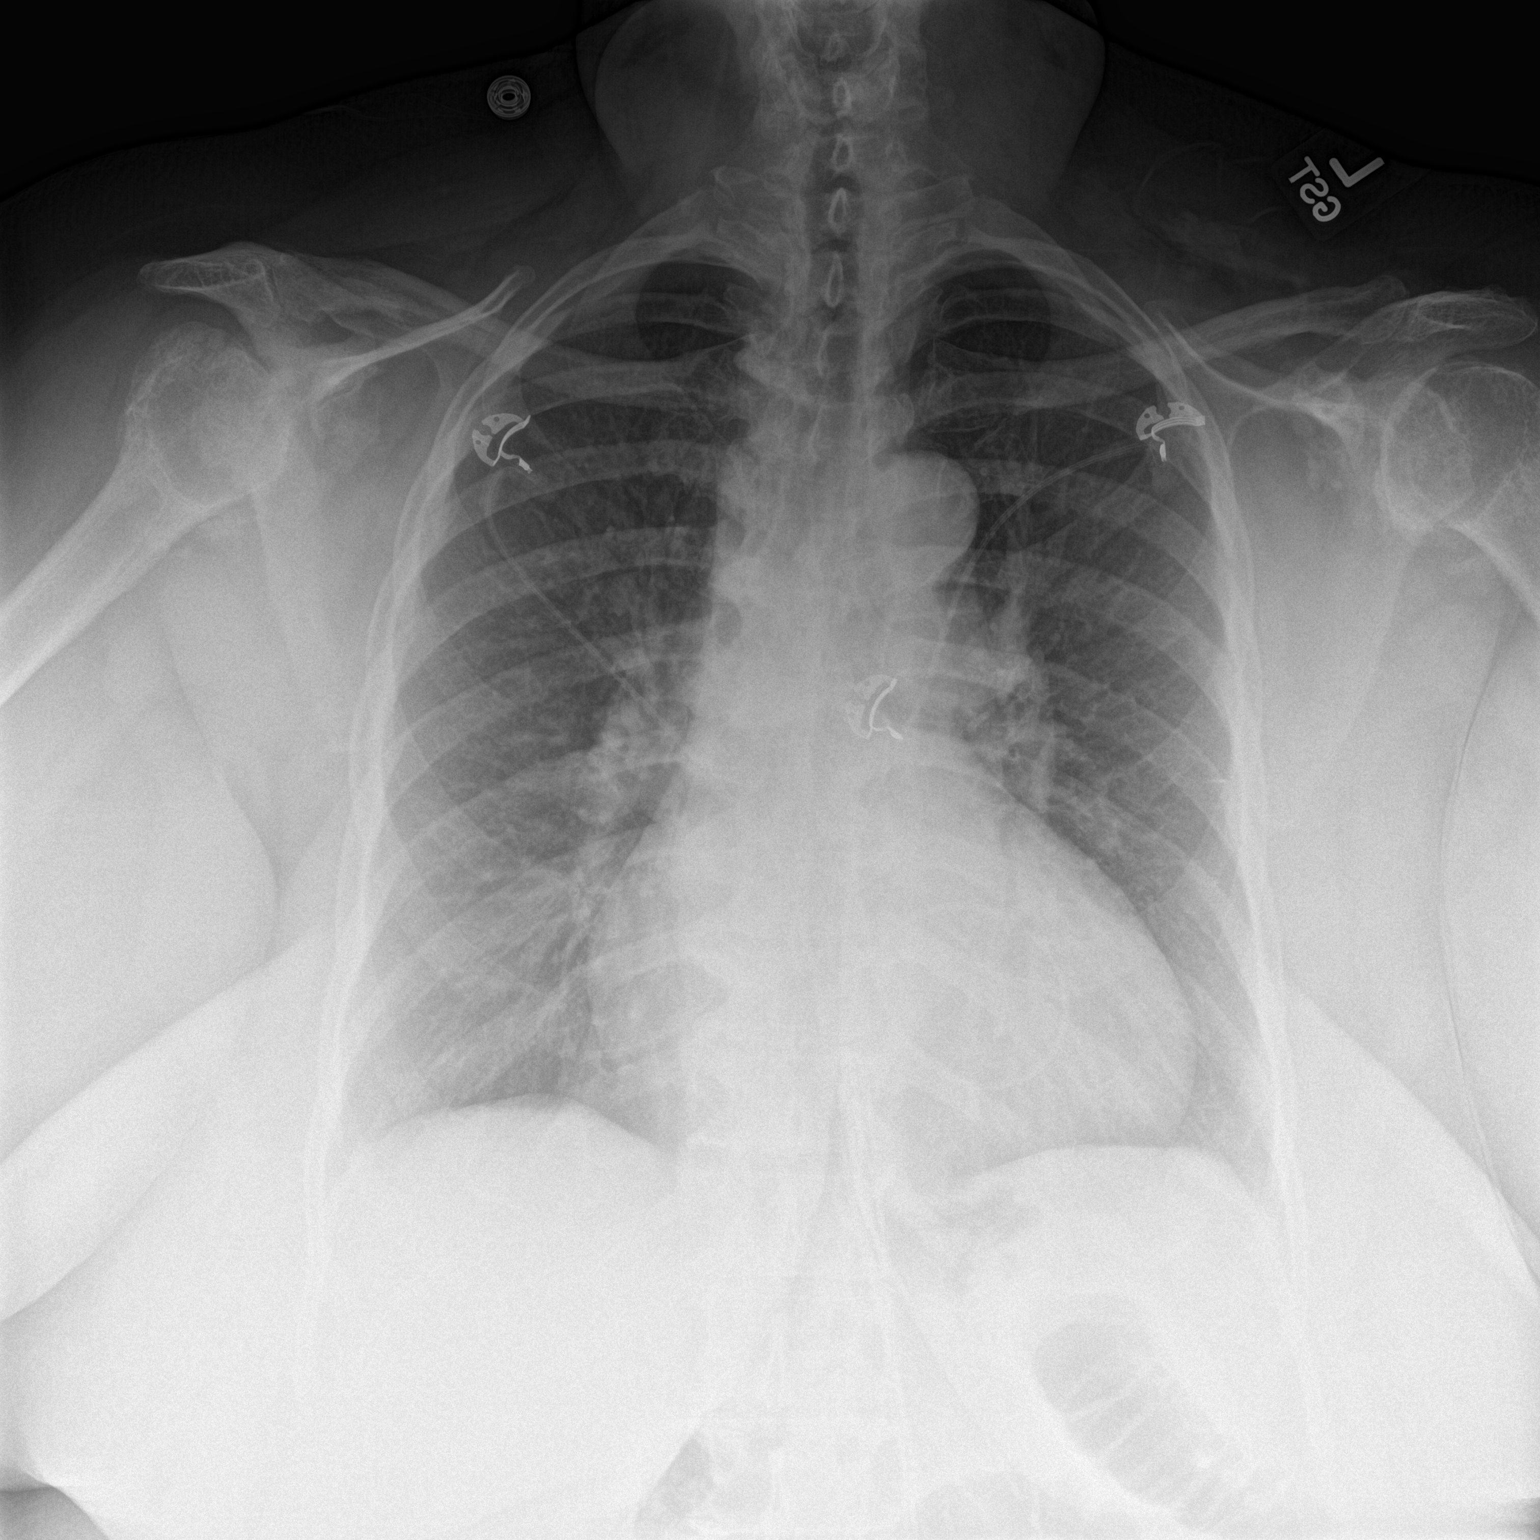

[chest lat]
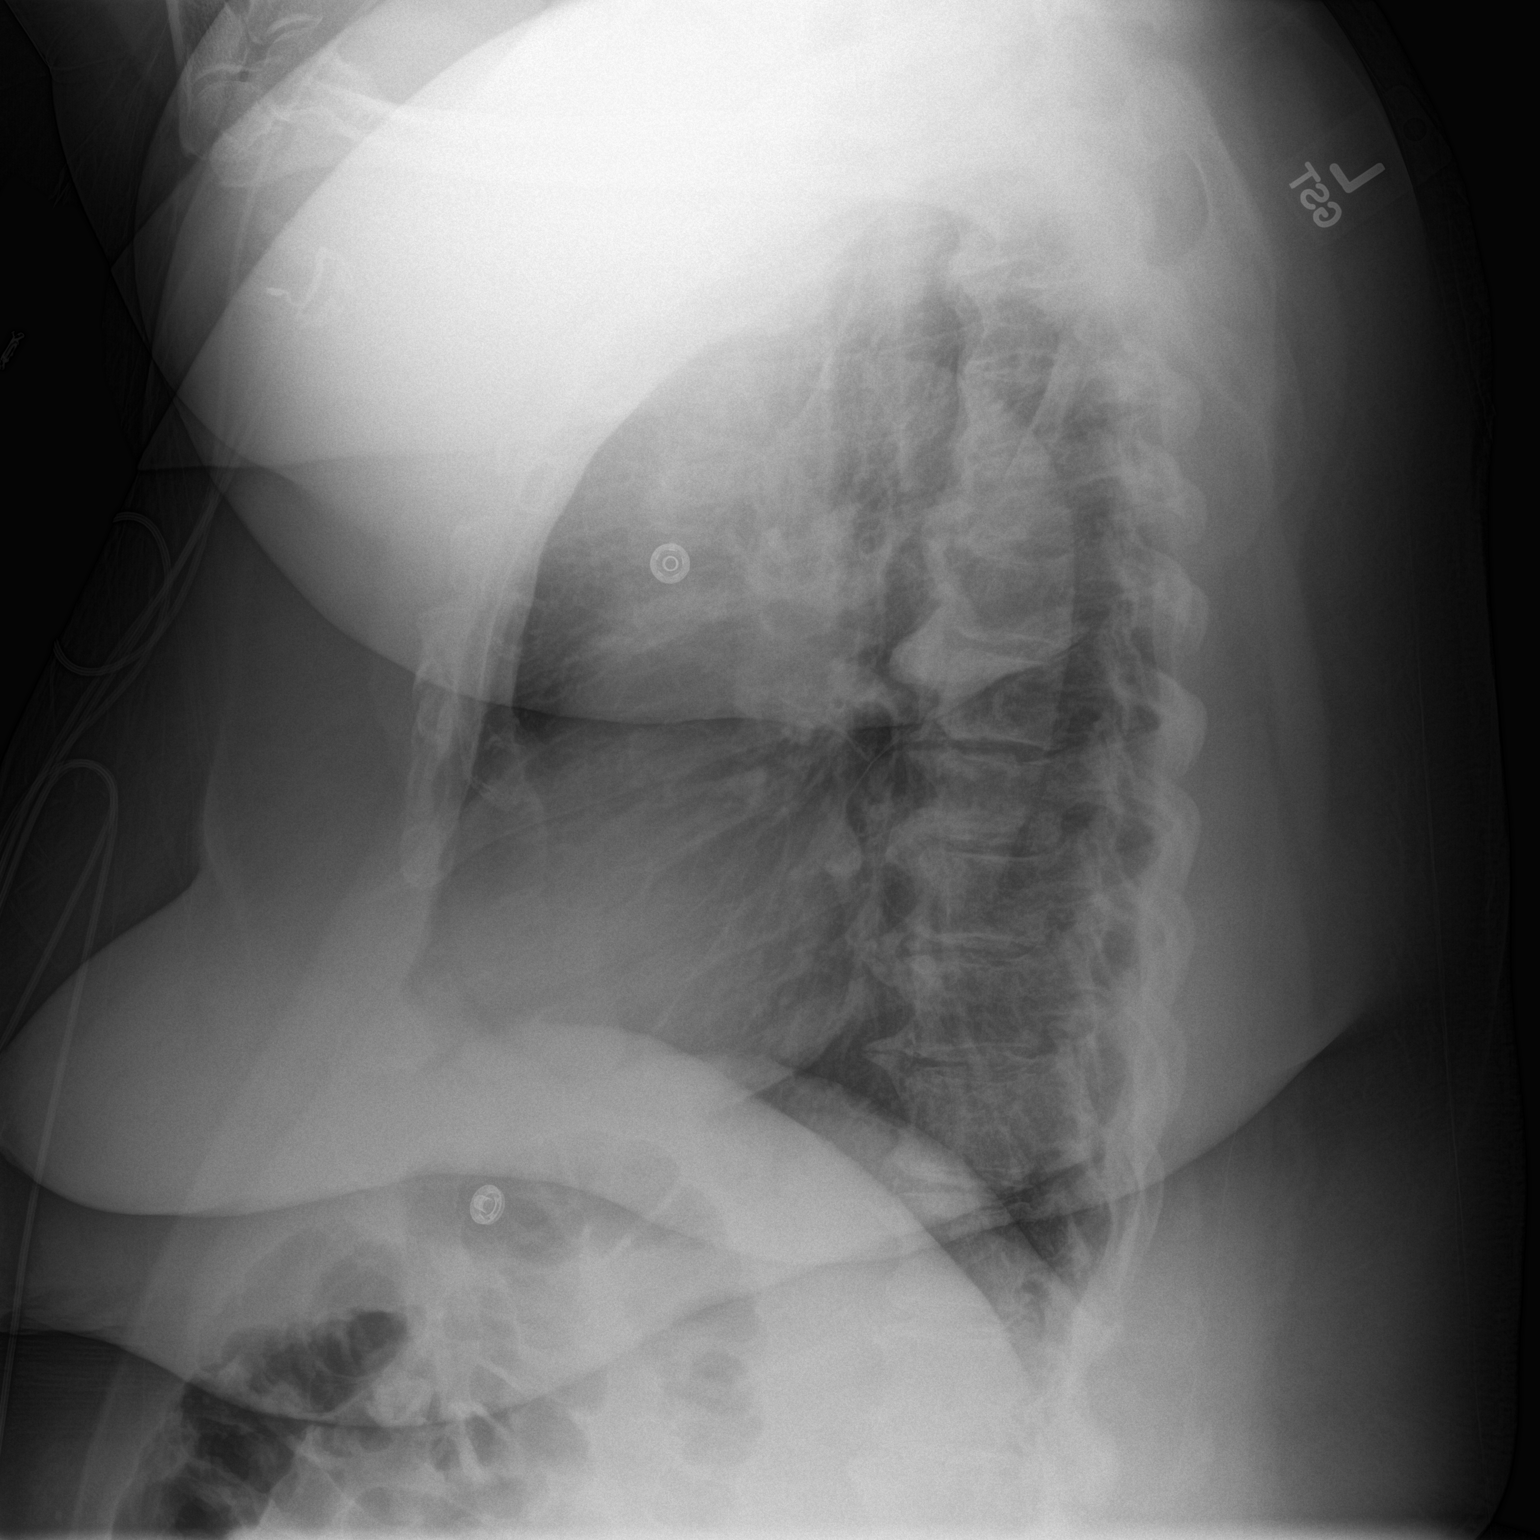

[2 of 2 positions shown; findings below may reference images not displayed]

FINDINGS: Lateral view degraded by patient arm position. Moderate thoracic
spondylosis. Numerous leads and wires project over the chest.
Degenerate changes in both glenohumeral joints. Midline trachea.
Mild cardiomegaly. Aortic atherosclerosis. Mediastinal contours
otherwise within normal limits. No pleural effusion or pneumothorax.
No congestive failure. Clear lungs.
IMPRESSION: Cardiomegaly without congestive failure.

Aortic atherosclerosis.
